# Patient Record
Sex: Female | Born: 1975 | Hispanic: No | Marital: Married | Smoking: Never smoker
Health system: Southern US, Community
[De-identification: ages and names within clinical notes are randomized; demographics above are authoritative.]

---

## 2007-10-29 ENCOUNTER — Ambulatory Visit (HOSPITAL_COMMUNITY): Admission: RE | Admit: 2007-10-29 | Discharge: 2007-10-29 | Payer: Self-pay | Admitting: Family Medicine

## 2007-11-11 HISTORY — PX: TUBAL LIGATION: SHX77

## 2007-12-06 ENCOUNTER — Ambulatory Visit (HOSPITAL_COMMUNITY): Admission: RE | Admit: 2007-12-06 | Discharge: 2007-12-06 | Payer: Self-pay | Admitting: Obstetrics & Gynecology

## 2007-12-06 IMAGING — US US OB DETAIL+14 WK
1 series · 14 of 28 positions shown · non-contrast
Comparison: none

OBSTETRICAL ULTRASOUND:
 This ultrasound exam was performed in the [HOSPITAL] Ultrasound Department.  The OB US report was generated in the AS system, and faxed to the ordering physician.  This report is also available in [REDACTED] PACS.

[Series 1: us ob detail+14 wk · 0.26mm/px · 14 of 82 slices shown]
[im 4/82]
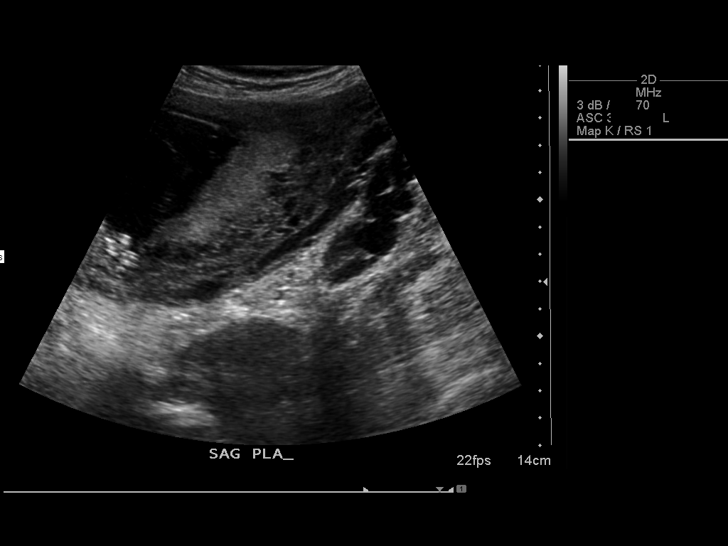
[im 10/82]
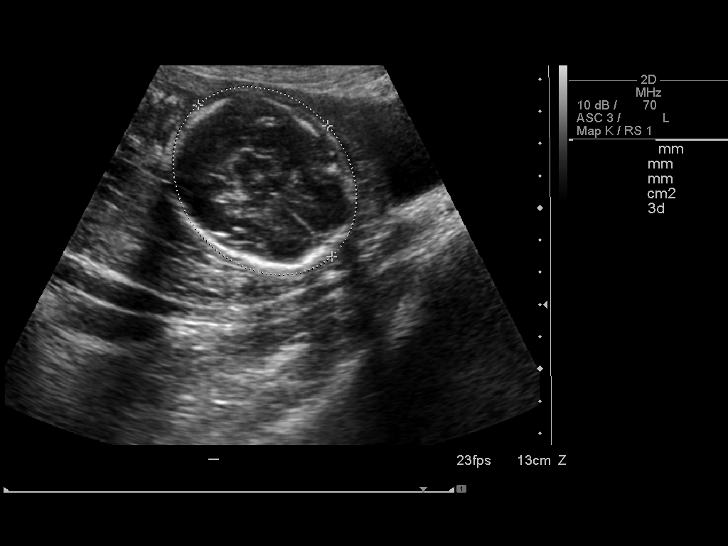
[im 16/82]
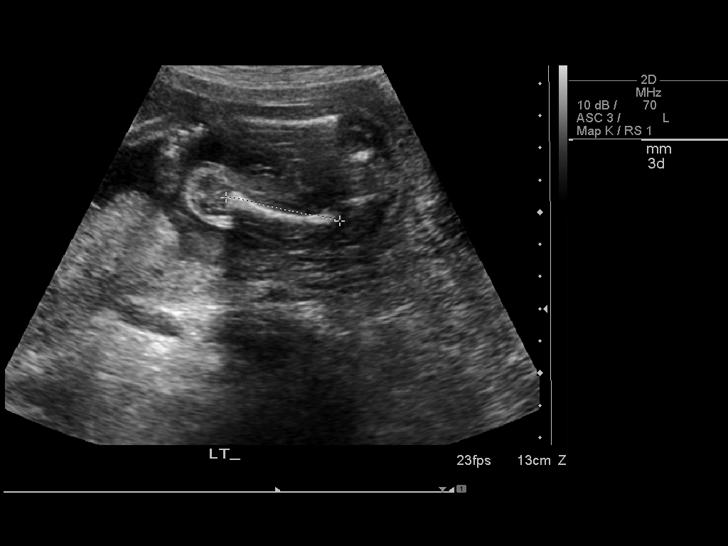
[im 22/82]
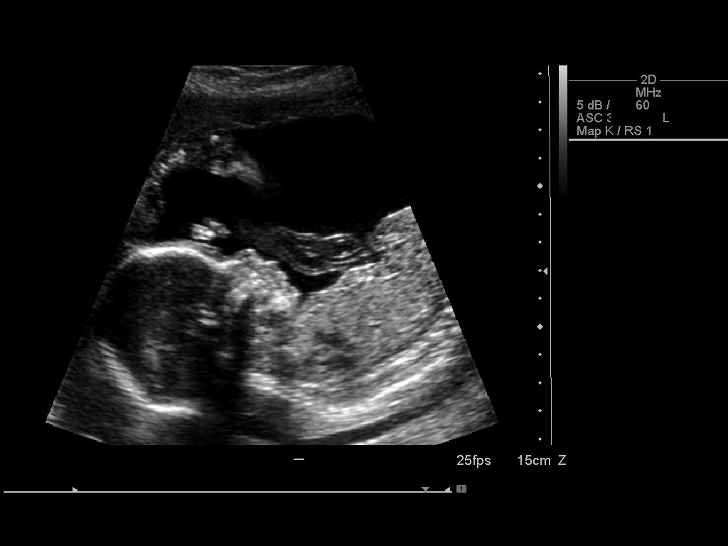
[im 28/82]
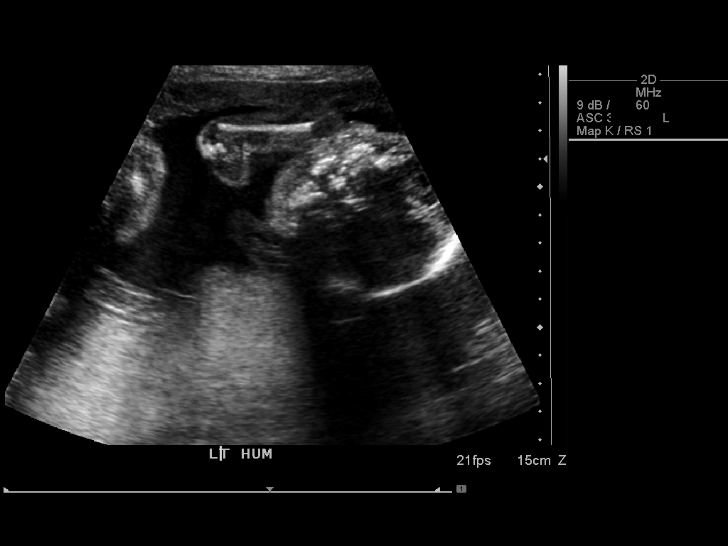
[im 34/82]
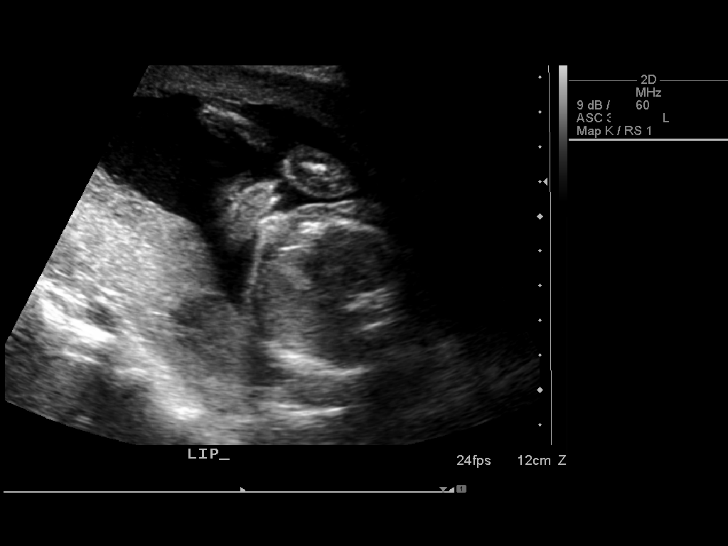
[im 40/82]
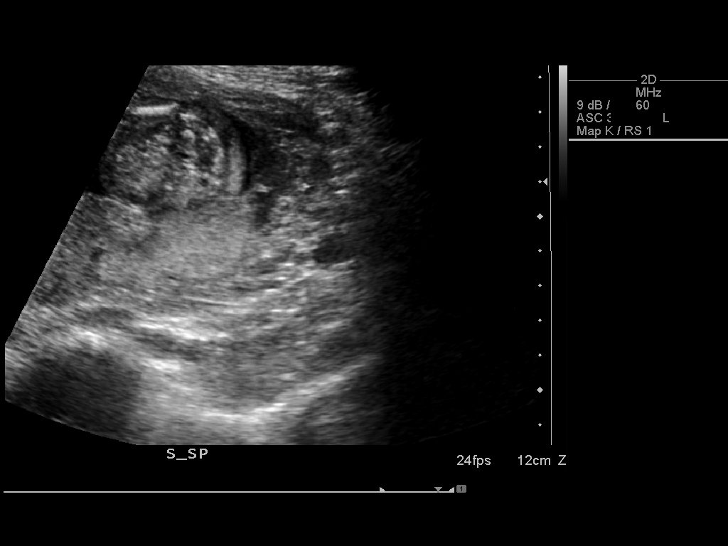
[im 46/82]
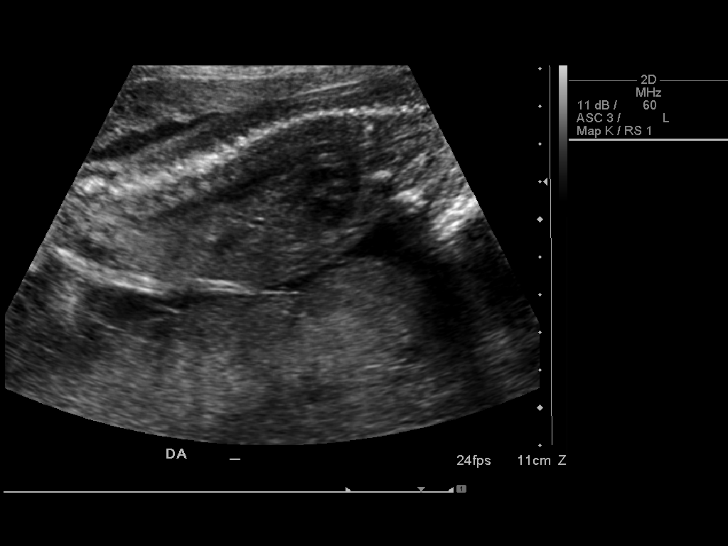
[im 52/82]
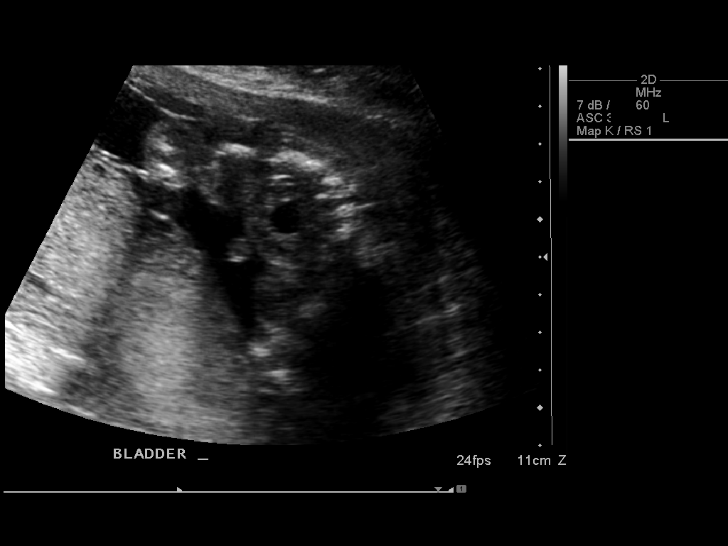
[im 58/82]
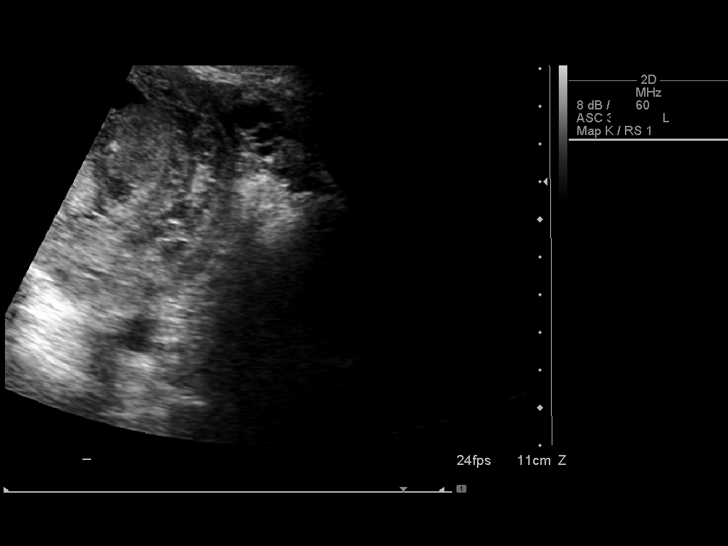
[im 64/82]
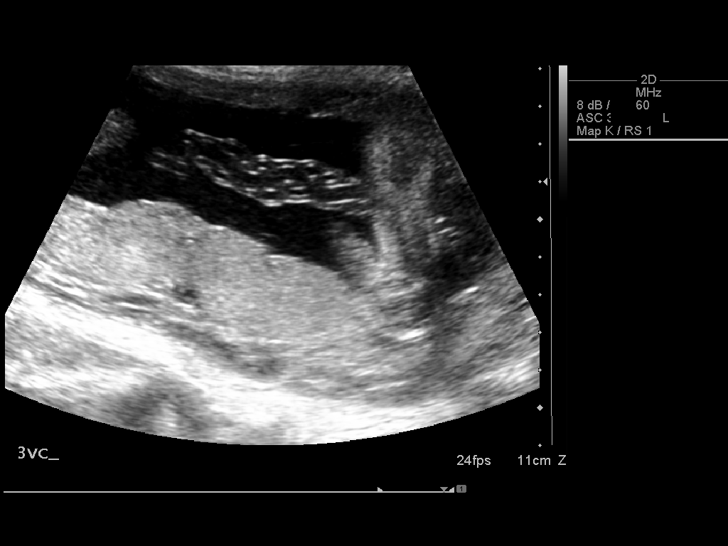
[im 70/82]
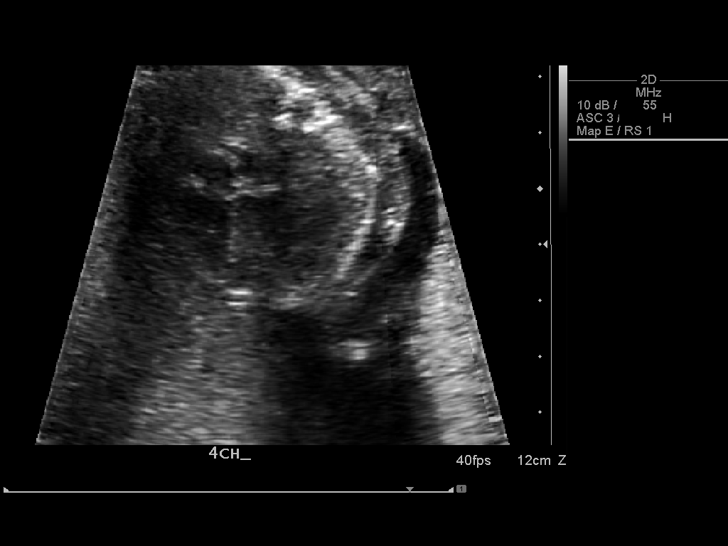
[im 76/82]
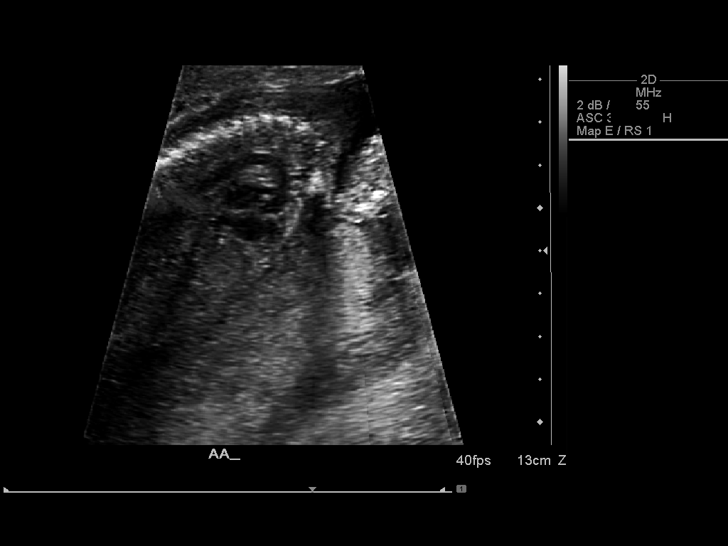
[im 82/82]
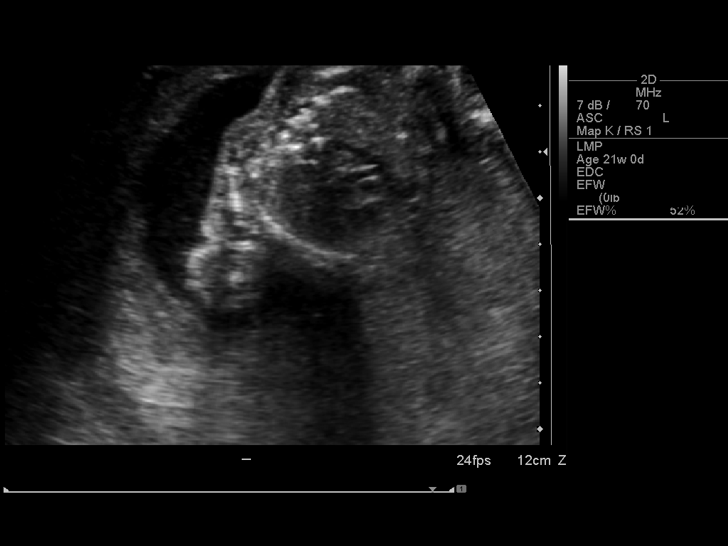

[14 of 28 positions shown; findings below may reference images not displayed]

IMPRESSION: See AS Obstetric US report.

## 2008-04-11 ENCOUNTER — Ambulatory Visit: Payer: Self-pay | Admitting: Obstetrics & Gynecology

## 2008-04-11 ENCOUNTER — Inpatient Hospital Stay (HOSPITAL_COMMUNITY): Admission: AD | Admit: 2008-04-11 | Discharge: 2008-04-13 | Payer: Self-pay | Admitting: Obstetrics & Gynecology

## 2010-05-23 LAB — CBC
Hemoglobin: 11.1 g/dL — ABNORMAL LOW (ref 12.0–15.0)
MCHC: 33 g/dL (ref 30.0–36.0)
RBC: 4.14 MIL/uL (ref 3.87–5.11)
RDW: 14.6 % (ref 11.5–15.5)

## 2010-05-23 LAB — RPR: RPR Ser Ql: NONREACTIVE

## 2010-06-25 NOTE — Discharge Summary (Signed)
NAMETERREE, GAULTNEY                    ACCOUNT NO.:  [id number]   MEDICAL RECORD NO.:  [id number]          PATIENT TYPE:  INP   LOCATION:  9122                          FACILITY:  [facility]   PHYSICIAN:  Allie Bossier, MD        DATE OF BIRTH:  1975-12-20   DATE OF ADMISSION:  04/11/2008  DATE OF DISCHARGE:  04/13/2008                               DISCHARGE SUMMARY   ADMISSION DIAGNOSES:  1. Grand multiparity.  2. Labor at term.   DISCHARGE DIAGNOSES:  1. Spontaneous vaginal delivery.  2. Vigorous female with Apgars 9 and 10.  3. Grand multiparity.  4. Precipitous delivery.   HISTORY:  This is 35 year old G6, P5-0-0-5, who presented to the  Maternity Admissions at 39-5/7th weeks walking in, and shortly after  arrival felt urge to push, was found to be complete +4 at initial exam,  progressed quickly to spontaneous vaginal delivery, vigorous female,  Apgars 9 at 1 minute, 10 at 5 minutes.  She had regular prenatal care at  [facility].  Her OB history was significant for having had 5 term  unattended home deliveries at [place] with no complications.  Significant lab; she is Rh positive, rubella immune and her hemoglobin  here is 11.1 on April 12, 2008.  On April 13, 2007, she underwent post ROM  sterilization by Dr. Marice Potter and this procedure was uncomplicated.  On  hospital day #2, she remained stable, was not having any significant  complaints.  Her vital signs were stable with BP 105/67.  Her  periumbilical incision was covered with small Band-Aid, was clean, dry,  and intact, essentially nontender.  She was involuting well.  She was  discharged home to have followup at [facility] at 6 weeks.  Her meds  included ibuprofen 600 one p.o. q.6 h, Vicodin one n.p.o. q.4 h #20 no  refills, prenatal vitamin one a day to continue for 6 weeks.      Deirdre Christy Gentles, C.N.M.      Allie Bossier, MD  Electronically Signed    DP/MEDQ  D:  04/13/2008  T:  04/13/2008  Job:  [id number]

## 2010-06-25 NOTE — Op Note (Signed)
Gail Mcfarland, Gail Mcfarland                    ACCOUNT NO.:  [id number]   MEDICAL RECORD NO.:  [id number]          PATIENT TYPE:  INP   LOCATION:  9122                          FACILITY:  [facility]   PHYSICIAN:  Allie Bossier, MD        DATE OF BIRTH:  09-21-1975   DATE OF PROCEDURE:  04/12/2008  DATE OF DISCHARGE:                               OPERATIVE REPORT   PREOPERATIVE DIAGNOSIS:  Multiparity, desires sterility.   POSTOPERATIVE DIAGNOSIS:  Multiparity, desires sterility.   PROCEDURE:  Application of Filshie clips, bilateral tubal sterilization  procedure.   SURGEON:  Allie Bossier, MD   ANESTHESIA:  Spinal, Malen Gauze, MD   COMPLICATIONS:  None.   ESTIMATED BLOOD LOSS:  Minimal.   FINDINGS:  Normal oviduct.   DETAIL OF PROCEDURE AND FINDINGS:  I used the language line to explain  the risks, benefits, and alternatives of surgery.  She understands the  failure rate of 1/200 and wishes to proceed.  In the operating room,  spinal anesthesia was applied.  Her abdomen was prepped and draped in  the usual sterile fashion.  After adequate anesthesia was assured, the  umbilicus was inspected.  It appeared that a transverse incision would  give the most appealing cosmetic result postoperatively.  I therefore  elevated the skin with Allis clamps and made a transverse incision in  the crease of the umbilicus.  The fascia was entered as well as  peritoneum.  I was able to visualize the right oviduct and it was  elevated and grasped with a Babcock clamp.  It was then traced to its  fimbriated end.  It was retraced the isthmic region where a Filshie clip  was placed across the entire oviduct in a perpendicular fashion,  approximately 1 mL of 0.5% Marcaine was injected just distal to the  Filshie clip for postop pain relief.  The tube was allowed to fall back  in the abdomen.  There was no bleeding noted.  A repeat procedure was  performed on the patient's contralateral side.  Again, the oviduct was  traced  to the fimbriated end and then retraced to the isthmic region  where a Filshie clip was placed across the entire tube.  Another 1 mL  0.5% Marcaine was injected into the tube just distal to the clamp.  The  tube was allowed to fall back in the abdominal cavity.  The fascia was  elevated with Allis clamps and closed with a running nonlocking 0 Vicryl  suture.  No defects were palpable.  The  subcutaneous tissue was infiltrated with another 8 mL of 0.5% Marcaine.  A subcuticular closure was done with 4-0 Vicryl suture.  Instrument,  sponge, and needle counts were correct.  She tolerated the procedure  well.  She was taken to the recovery room in stable condition.      Allie Bossier, MD  Electronically Signed     MCD/MEDQ  D:  04/12/2008  T:  04/12/2008  Job:  [id number]

## 2012-02-27 ENCOUNTER — Encounter (HOSPITAL_COMMUNITY): Payer: Self-pay | Admitting: *Deleted

## 2012-02-27 ENCOUNTER — Emergency Department (INDEPENDENT_AMBULATORY_CARE_PROVIDER_SITE_OTHER)
Admission: EM | Admit: 2012-02-27 | Discharge: 2012-02-27 | Disposition: A | Discharge status: Home / Self Care | Payer: 59 | Source: Home / Self Care | Attending: Emergency Medicine | Admitting: Emergency Medicine

## 2012-02-27 DIAGNOSIS — N3000 Acute cystitis without hematuria: Secondary | ICD-10-CM

## 2012-02-27 LAB — POCT URINALYSIS DIP (DEVICE)
Specific Gravity, Urine: 1.02 (ref 1.005–1.030)
Urobilinogen, UA: 0.2 mg/dL (ref 0.0–1.0)

## 2012-02-27 MED ORDER — PHENAZOPYRIDINE HCL 200 MG PO TABS: 200.0000 mg | ORAL_TABLET | Freq: Three times a day (TID) | ORAL | Status: DC | PRN

## 2012-02-27 MED ORDER — CEPHALEXIN 500 MG PO CAPS: 500.0000 mg | ORAL_CAPSULE | Freq: Three times a day (TID) | ORAL | Status: DC

## 2012-02-27 NOTE — ED Provider Notes (Signed)
Chief Complaint  Patient presents with  . Hematuria    History of Present Illness:   Gail Mcfarland is a 37 year old [place] female who presents with a one-day history of blood in the urine, dysuria, frequency, chills, and suprapubic pain. She denies fever, nausea, vomiting, back pain, or GYN complaints. She denies any prior history of urinary tract infection.  Review of Systems:  Other than noted above, the patient denies any of the following symptoms: General:  No fevers, chills, sweats, aches, or fatigue. GI:  No abdominal pain, back pain, nausea, vomiting, diarrhea, or constipation. GU:  No dysuria, frequency, urgency, hematuria, or incontinence. GYN:  No discharge, itching, vulvar pain or lesions, pelvic pain, or abnormal vaginal bleeding.  PMFSH:  Past medical history, family history, social history, meds, and allergies were reviewed.  Physical Exam:   Vital signs:  BP 136/76  Pulse 67  Temp 98.5 F (36.9 C) (Oral)  Resp 16  SpO2 98%  LMP 02/13/2012 Gen:  Alert, oriented, in no distress. Lungs:  Clear to auscultation, no wheezes, rales or rhonchi. Heart:  Regular rhythm, no gallop or murmer. Abdomen:  Flat and soft. There was slight suprapubic pain to palpation.  No guarding, or rebound.  No hepato-splenomegaly or mass.  Bowel sounds were normally active.  No hernia. Back:  No CVA tenderness.  Skin:  Clear, warm and dry.  Labs:   Results for orders placed during the hospital encounter of 02/27/12  POCT URINALYSIS DIP (DEVICE)      Component Value Range   Glucose, UA NEGATIVE  NEGATIVE mg/dL   Bilirubin Urine NEGATIVE  NEGATIVE   Ketones, ur NEGATIVE  NEGATIVE mg/dL   Specific Gravity, Urine 1.020  1.005 - 1.030   Hgb urine dipstick LARGE (*) NEGATIVE   pH 7.5  5.0 - 8.0   Protein, ur 100 (*) NEGATIVE mg/dL   Urobilinogen, UA 0.2  0.0 - 1.0 mg/dL   Nitrite NEGATIVE  NEGATIVE   Leukocytes, UA LARGE (*) NEGATIVE  POCT PREGNANCY, URINE      Component Value Range   Preg  Test, Ur NEGATIVE  NEGATIVE     Other Labs Obtained at Urgent Care Center:  A urine culture was obtained.  Results are pending at this time and we will call about any positive results.  Assessment: The encounter diagnosis was Acute cystitis.   Plan:   1.  The following meds were prescribed:   New Prescriptions   CEPHALEXIN (KEFLEX) 500 MG CAPSULE    Take 1 capsule (500 mg total) by mouth 3 (three) times daily.   PHENAZOPYRIDINE (PYRIDIUM) 200 MG TABLET    Take 1 tablet (200 mg total) by mouth 3 (three) times daily as needed for pain.   2.  The patient was instructed in symptomatic care and handouts were given. 3.  The patient was told to return if becoming worse in any way, if no better in 3 or 4 days, and given some red flag symptoms that would indicate earlier return. 4.  The patient was told to avoid intercourse for 10 days, get extra fluids, and return for a follow up with her primary care doctor at the completion of treatment for a repeat UA and culture.     Reuben Likes, MD 02/27/12 [phone]

## 2012-02-27 NOTE — ED Notes (Signed)
C/o hematuria, frequency in small amounts.  C/o pain with urination.

## 2012-02-29 LAB — URINE CULTURE
Colony Count: 4000
Special Requests: NORMAL

## 2013-03-09 ENCOUNTER — Ambulatory Visit: Payer: 59 | Admitting: Physician Assistant

## 2013-03-14 ENCOUNTER — Other Ambulatory Visit (INDEPENDENT_AMBULATORY_CARE_PROVIDER_SITE_OTHER): Payer: 59

## 2013-03-14 ENCOUNTER — Ambulatory Visit: Payer: 59 | Admitting: Internal Medicine

## 2013-03-14 ENCOUNTER — Ambulatory Visit (INDEPENDENT_AMBULATORY_CARE_PROVIDER_SITE_OTHER): Payer: 59 | Admitting: Physician Assistant

## 2013-03-14 ENCOUNTER — Encounter: Payer: Self-pay | Admitting: Physician Assistant

## 2013-03-14 VITALS — BP 100/68 | HR 73 | Temp 98.0°F | Resp 20 | Ht <= 58 in | Wt 104.0 lb

## 2013-03-14 DIAGNOSIS — R1013 Epigastric pain: Secondary | ICD-10-CM

## 2013-03-14 DIAGNOSIS — R319 Hematuria, unspecified: Secondary | ICD-10-CM

## 2013-03-14 DIAGNOSIS — N926 Irregular menstruation, unspecified: Secondary | ICD-10-CM

## 2013-03-14 LAB — LIPASE: Lipase: 13 U/L (ref 11.0–59.0)

## 2013-03-14 LAB — CBC WITH DIFFERENTIAL/PLATELET
Basophils Absolute: 0 10*3/uL (ref 0.0–0.1)
Basophils Relative: 0.8 % (ref 0.0–3.0)
EOS PCT: 3.1 % (ref 0.0–5.0)
Eosinophils Absolute: 0.2 10*3/uL (ref 0.0–0.7)
HCT: 42.1 % (ref 36.0–46.0)
HEMOGLOBIN: 13.9 g/dL (ref 12.0–15.0)
Lymphocytes Relative: 28.5 % (ref 12.0–46.0)
Lymphs Abs: 1.6 10*3/uL (ref 0.7–4.0)
MCHC: 32.9 g/dL (ref 30.0–36.0)
MCV: 88.1 fl (ref 78.0–100.0)
Monocytes Absolute: 0.3 10*3/uL (ref 0.1–1.0)
Monocytes Relative: 4.8 % (ref 3.0–12.0)
Neutro Abs: 3.6 10*3/uL (ref 1.4–7.7)
Neutrophils Relative %: 62.8 % (ref 43.0–77.0)
Platelets: 282 10*3/uL (ref 150.0–400.0)
RBC: 4.78 Mil/uL (ref 3.87–5.11)
RDW: 13 % (ref 11.5–14.6)
WBC: 5.7 10*3/uL (ref 4.5–10.5)

## 2013-03-14 LAB — BASIC METABOLIC PANEL
BUN: 10 mg/dL (ref 6–23)
CALCIUM: 8.9 mg/dL (ref 8.4–10.5)
CO2: 28 meq/L (ref 19–32)
CREATININE: 0.6 mg/dL (ref 0.4–1.2)
Chloride: 103 mEq/L (ref 96–112)
GFR: 131.84 mL/min (ref >60.00)
GLUCOSE: 89 mg/dL (ref 70–99)
POTASSIUM: 3.9 meq/L (ref 3.5–5.1)
SODIUM: 137 meq/L (ref 135–145)

## 2013-03-14 LAB — POCT URINALYSIS DIPSTICK
BILIRUBIN UA: NEGATIVE
Blood, UA: NEGATIVE
GLUCOSE: NEGATIVE
Leukocytes, UA: NEGATIVE
Nitrite, UA: NEGATIVE
SPEC GRAV UA: 1.015
UROBILINOGEN UA: 0.2
pH, UA: 7

## 2013-03-14 LAB — HEPATIC FUNCTION PANEL
ALT: 15 U/L (ref 0–35)
AST: 21 U/L (ref 0–37)
Albumin: 4.1 g/dL (ref 3.5–5.2)
Alkaline Phosphatase: 67 U/L (ref 39–117)
Bilirubin, Direct: 0 mg/dL (ref 0.0–0.3)
Total Bilirubin: 0.6 mg/dL (ref 0.3–1.2)
Total Protein: 7.8 g/dL (ref 6.0–8.3)

## 2013-03-14 LAB — HCG, QUANTITATIVE, PREGNANCY: HCG, BETA CHAIN, QUANT, S: 0.35 m[IU]/mL

## 2013-03-14 LAB — AMYLASE: AMYLASE: 93 U/L (ref 27–131)

## 2013-03-14 NOTE — Progress Notes (Signed)
Subjective:    Patient ID: Virgina H Mcfarland, female    DOB: 08-12-1975, 38 y.o.   MRN: [id number]  HPI Comments: Patient is a 38 year old female who presents to the clinic today to establish care. Patient is [place] and speaks small amount of english. Is accompanied by appointed female liaison. Patient reports a recent history of irregular menstrual cycle, stating they are lasting longer than normal .LMP 02/10/13. States after the last cycle stopped she has seen blood in the toilet and on the tissue when urinating. Reports she is having pain in the upper abdomen also. States pain last for a few minutes and then goes away. No known mitigating or alleviating factors. Reports she has noted an increase in frequency and urgency with urination as well. Has noted an increase in fatigue. Denies N/V/F/C, back/flank pain, blood in stool, dark tarry stool, heartburn symptoms or abdominal bloat or distention.       Review of Systems  Constitutional: Positive for fatigue. Negative for fever, chills and activity change.  Respiratory: Negative for chest tightness, shortness of breath and wheezing.   Gastrointestinal: Negative for nausea, vomiting, diarrhea, constipation and blood in stool.  Genitourinary: Positive for urgency, frequency and hematuria. Negative for dysuria, flank pain, vaginal discharge, vaginal pain, pelvic pain and dyspareunia.  Musculoskeletal: Negative for back pain.  Skin: Negative for rash.  All other systems reviewed and are negative.  History reviewed. No pertinent past medical history. History reviewed. No pertinent family history. History   Social History  . Marital Status: Married    Spouse Name: N/A    Number of Children: N/A  . Years of Education: N/A   Social History Main Topics  . Smoking status: Never Smoker   . Smokeless tobacco: None  . Alcohol Use: No  . Drug Use: No  . Sexual Activity:    Other Topics Concern  . None   Social History Narrative  . None          Objective:   Physical Exam  Vitals reviewed. Constitutional: She is oriented to person, place, and time. She appears well-developed and well-nourished. No distress.  HENT:  Head: Normocephalic and atraumatic.  Eyes: Conjunctivae are normal.  Neck: Normal range of motion.  Cardiovascular: Normal rate, regular rhythm and intact distal pulses.  Exam reveals no gallop and no friction rub.   No murmur heard. Pulmonary/Chest: Effort normal and breath sounds normal. She has no wheezes. She has no rales.  Abdominal: Soft. Normal appearance and bowel sounds are normal. There is no tenderness. There is no rebound, no guarding and no CVA tenderness.  Abdomen benign, non tender to palpation. Unable to reproduce epigastric pain.  Genitourinary: Rectal exam shows no external hemorrhoid. Pelvic exam was performed with patient prone. There is no tenderness on the right labia. There is no tenderness on the left labia. Cervix exhibits no motion tenderness and no discharge. Right adnexum displays no mass and no tenderness. Left adnexum displays no mass and no tenderness. No erythema, tenderness or bleeding around the vagina. No vaginal discharge found.  Exam chaperoned by Basilia Jumbo, CMA  Musculoskeletal: Normal range of motion.  Lymphadenopathy:       Right: No inguinal adenopathy present.       Left: No inguinal adenopathy present.  Neurological: She is alert and oriented to person, place, and time.  Skin: Skin is warm and dry. She is not diaphoretic.  Psychiatric: She has a normal mood and affect.  Filed Vitals:   03/14/13 1115  BP: 100/68  Pulse: 73  Temp: 98 F (36.7 C)  Resp: 20    Results for orders placed in visit on 03/14/13  POCT URINALYSIS DIPSTICK      Result Value Range   Color, UA yellow     Clarity, UA clear     Glucose NEG     Bilirubin, UA NEG     Ketones, UA SM     Spec Grav, UA 1.015     Blood, UA NEG     pH, UA 7.0     Protein, UA TRACE     Urobilinogen, UA 0.2      Nitrite, UA NEG     Leukocytes, UA Negative     Past Surgical History  Procedure Laterality Date  . Tubal ligation  2009/10       Assessment & Plan:    Patient presents to establish care.  Physical exam WNL, POC urine negative for UTI, no blood in urine. Will order labs and review. Counseled patient and liaison if labs normal will consider further testing/imaging.   Referral for GYN  Patient stated understanding and agreement with treatment plan.

## 2013-03-14 NOTE — Progress Notes (Signed)
Pre-visit discussion using our clinic review tool. No additional management support is needed unless otherwise documented below in the visit note.  

## 2013-03-14 NOTE — Patient Instructions (Addendum)
It was great meeting you today Gail Mcfarland! Labs have been ordered for you.   Hematuria, Adult Hematuria is blood in your urine. It can be caused by a bladder infection, kidney infection, prostate infection, kidney stone, or cancer of your urinary tract. Infections can usually be treated with medicine, and a kidney stone usually will pass through your urine. If neither of these is the cause of your hematuria, further workup to find out the reason may be needed. It is very important that you tell your health care provider about any blood you see in your urine, even if the blood stops without treatment or happens without causing pain. Blood in your urine that happens and then stops and then happens again can be a symptom of a very serious condition. Also, pain is not a symptom in the initial stages of many urinary cancers. HOME CARE INSTRUCTIONS   Drink lots of fluid, 3 4 quarts a day. If you have been diagnosed with an infection, cranberry juice is especially recommended, in addition to large amounts of water.  Avoid caffeine, tea, and carbonated beverages, because they tend to irritate the bladder.  Avoid alcohol because it may irritate the prostate.  Only take over-the-counter or prescription medicines for pain, discomfort, or fever as directed by your health care provider.  If you have been diagnosed with a kidney stone, follow your health care provider's instructions regarding straining your urine to catch the stone.  Empty your bladder often. Avoid holding urine for long periods of time.  After a bowel movement, women should cleanse front to back. Use each tissue only once.  Empty your bladder before and after sexual intercourse if you are a female. SEEK MEDICAL CARE IF: You develop back pain, fever, a feeling of sickness in your stomach (nausea), or vomiting or if your symptoms are not better in 3 days. Return sooner if you are getting worse. SEEK IMMEDIATE MEDICAL CARE IF:   You have a  persistent fever, with a temperature of 101.59F (38.8C) or greater.  You develop severe vomiting and are unable to keep the medicine down.  You develop severe back or abdominal pain despite taking your medicines.  You begin passing a large amount of blood or clots in your urine.  You feel extremely weak or faint, or you pass out. MAKE SURE YOU:   Understand these instructions.  Will watch your condition.  Will get help right away if you are not doing well or get worse. Document Released: 01/27/2005 Document Revised: 11/17/2012 Document Reviewed: 09/27/2012 [facility] Patient Information 2014 [place], [place].  Abdominal Pain, Adult Many things can cause belly (abdominal) pain. Most times, the belly pain is not dangerous. Many cases of belly pain can be watched and treated at home. HOME CARE   Do not take medicines that help you go poop (laxatives) unless told to by your doctor.  Only take medicine as told by your doctor.  Eat or drink as told by your doctor. Your doctor will tell you if you should be on a special diet. GET HELP IF:  You do not know what is causing your belly pain.  You have belly pain while you are sick to your stomach (nauseous) or have runny poop (diarrhea).  You have pain while you pee or poop.  Your belly pain wakes you up at night.  You have belly pain that gets worse or better when you eat.  You have belly pain that gets worse when you eat fatty foods. GET  HELP RIGHT AWAY IF:   The pain does not go away within 2 hours.  You have a fever.  You keep throwing up (vomiting).  The pain changes and is only in the right or left part of the belly.  You have bloody or tarry looking poop. MAKE SURE YOU:   Understand these instructions.  Will watch your condition.  Will get help right away if you are not doing well or get worse. Document Released: 07/16/2007 Document Revised: 11/17/2012 Document Reviewed: 10/06/2012 [facility] Patient Information  2014 [place], [place].

## 2013-05-02 ENCOUNTER — Encounter: Payer: 59 | Admitting: Obstetrics & Gynecology

## 2013-11-25 ENCOUNTER — Other Ambulatory Visit (INDEPENDENT_AMBULATORY_CARE_PROVIDER_SITE_OTHER): Payer: 59

## 2013-11-25 ENCOUNTER — Encounter: Payer: Self-pay | Admitting: Internal Medicine

## 2013-11-25 ENCOUNTER — Ambulatory Visit (INDEPENDENT_AMBULATORY_CARE_PROVIDER_SITE_OTHER): Payer: 59 | Admitting: Internal Medicine

## 2013-11-25 VITALS — BP 122/80 | HR 66 | Temp 97.8°F | Resp 16 | Ht <= 58 in | Wt 108.0 lb

## 2013-11-25 DIAGNOSIS — Z0000 Encounter for general adult medical examination without abnormal findings: Secondary | ICD-10-CM

## 2013-11-25 DIAGNOSIS — N926 Irregular menstruation, unspecified: Secondary | ICD-10-CM | POA: Insufficient documentation

## 2013-11-25 LAB — CBC WITH DIFFERENTIAL/PLATELET
Basophils Absolute: 0 10*3/uL (ref 0.0–0.1)
Basophils Relative: 1 % (ref 0.0–3.0)
EOS PCT: 3.4 % (ref 0.0–5.0)
Eosinophils Absolute: 0.2 10*3/uL (ref 0.0–0.7)
HCT: 39.3 % (ref 36.0–46.0)
Hemoglobin: 12.9 g/dL (ref 12.0–15.0)
LYMPHS ABS: 1.7 10*3/uL (ref 0.7–4.0)
Lymphocytes Relative: 36.6 % (ref 12.0–46.0)
MCHC: 32.9 g/dL (ref 30.0–36.0)
MCV: 84.3 fl (ref 78.0–100.0)
MONOS PCT: 6.5 % (ref 3.0–12.0)
Monocytes Absolute: 0.3 10*3/uL (ref 0.1–1.0)
NEUTROS ABS: 2.5 10*3/uL (ref 1.4–7.7)
Neutrophils Relative %: 52.5 % (ref 43.0–77.0)
PLATELETS: 262 10*3/uL (ref 150.0–400.0)
RBC: 4.66 Mil/uL (ref 3.87–5.11)
RDW: 13.6 % (ref 11.5–15.5)
WBC: 4.7 10*3/uL (ref 4.0–10.5)

## 2013-11-25 LAB — LIPID PANEL
CHOLESTEROL: 170 mg/dL (ref 0–200)
HDL: 38.1 mg/dL — AB (ref >39.00)
LDL Cholesterol: 106 mg/dL — ABNORMAL HIGH (ref 0–99)
NONHDL: 131.9
TRIGLYCERIDES: 128 mg/dL (ref 0.0–149.0)
Total CHOL/HDL Ratio: 4
VLDL: 25.6 mg/dL (ref 0.0–40.0)

## 2013-11-25 LAB — COMPREHENSIVE METABOLIC PANEL
ALK PHOS: 58 U/L (ref 39–117)
ALT: 12 U/L (ref 0–35)
AST: 16 U/L (ref 0–37)
Albumin: 3.6 g/dL (ref 3.5–5.2)
BILIRUBIN TOTAL: 0.4 mg/dL (ref 0.2–1.2)
BUN: 8 mg/dL (ref 6–23)
CALCIUM: 9.1 mg/dL (ref 8.4–10.5)
CHLORIDE: 105 meq/L (ref 96–112)
CO2: 27 meq/L (ref 19–32)
CREATININE: 0.6 mg/dL (ref 0.4–1.2)
GFR: 121.12 mL/min (ref >60.00)
GLUCOSE: 101 mg/dL — AB (ref 70–99)
POTASSIUM: 4 meq/L (ref 3.5–5.1)
SODIUM: 137 meq/L (ref 135–145)
TOTAL PROTEIN: 7.9 g/dL (ref 6.0–8.3)

## 2013-11-25 LAB — TSH: TSH: 0.91 u[IU]/mL (ref 0.35–4.50)

## 2013-11-25 LAB — HCG, QUANTITATIVE, PREGNANCY: QUANTITATIVE HCG: 0 m[IU]/mL

## 2013-11-25 NOTE — Progress Notes (Signed)
Pre visit review using our clinic review tool, if applicable. No additional management support is needed unless otherwise documented below in the visit note. 

## 2013-11-25 NOTE — Patient Instructions (Signed)
Preventive Care for Adults A healthy lifestyle and preventive care can promote health and wellness. Preventive health guidelines for women include the following key practices.  A routine yearly physical is a good way to check with your health care provider about your health and preventive screening. It is a chance to share any concerns and updates on your health and to receive a thorough exam.  Visit your dentist for a routine exam and preventive care every 6 months. Brush your teeth twice a day and floss once a day. Good oral hygiene prevents tooth decay and gum disease.  The frequency of eye exams is based on your age, health, family medical history, use of contact lenses, and other factors. Follow your health care provider's recommendations for frequency of eye exams.  Eat a healthy diet. Foods like vegetables, fruits, whole grains, low-fat dairy products, and lean protein foods contain the nutrients you need without too many calories. Decrease your intake of foods high in solid fats, added sugars, and salt. Eat the right amount of calories for you.Get information about a proper diet from your health care provider, if necessary.  Regular physical exercise is one of the most important things you can do for your health. Most adults should get at least 150 minutes of moderate-intensity exercise (any activity that increases your heart rate and causes you to sweat) each week. In addition, most adults need muscle-strengthening exercises on 2 or more days a week.  Maintain a healthy weight. The body mass index (BMI) is a screening tool to identify possible weight problems. It provides an estimate of body fat based on height and weight. Your health care provider can find your BMI and can help you achieve or maintain a healthy weight.For adults 20 years and older:  A BMI below 18.5 is considered underweight.  A BMI of 18.5 to 24.9 is normal.  A BMI of 25 to 29.9 is considered overweight.  A BMI of  30 and above is considered obese.  Maintain normal blood lipids and cholesterol levels by exercising and minimizing your intake of saturated fat. Eat a balanced diet with plenty of fruit and vegetables. Blood tests for lipids and cholesterol should begin at age 76 and be repeated every 5 years. If your lipid or cholesterol levels are high, you are over 50, or you are at high risk for heart disease, you may need your cholesterol levels checked more frequently.Ongoing high lipid and cholesterol levels should be treated with medicines if diet and exercise are not working.  If you smoke, find out from your health care provider how to quit. If you do not use tobacco, do not start.  Lung cancer screening is recommended for adults aged 22-80 years who are at high risk for developing lung cancer because of a history of smoking. A yearly low-dose CT scan of the lungs is recommended for people who have at least a 30-pack-year history of smoking and are a current smoker or have quit within the past 15 years. A pack year of smoking is smoking an average of 1 pack of cigarettes a day for 1 year (for example: 1 pack a day for 30 years or 2 packs a day for 15 years). Yearly screening should continue until the smoker has stopped smoking for at least 15 years. Yearly screening should be stopped for people who develop a health problem that would prevent them from having lung cancer treatment.  If you are pregnant, do not drink alcohol. If you are breastfeeding,  be very cautious about drinking alcohol. If you are not pregnant and choose to drink alcohol, do not have more than 1 drink per day. One drink is considered to be 12 ounces (355 mL) of beer, 5 ounces (148 mL) of wine, or 1.5 ounces (44 mL) of liquor.  Avoid use of street drugs. Do not share needles with anyone. Ask for help if you need support or instructions about stopping the use of drugs.  High blood pressure causes heart disease and increases the risk of  stroke. Your blood pressure should be checked at least every 1 to 2 years. Ongoing high blood pressure should be treated with medicines if weight loss and exercise do not work.  If you are 3-86 years old, ask your health care provider if you should take aspirin to prevent strokes.  Diabetes screening involves taking a blood sample to check your fasting blood sugar level. This should be done once every 3 years, after age 67, if you are within normal weight and without risk factors for diabetes. Testing should be considered at a younger age or be carried out more frequently if you are overweight and have at least 1 risk factor for diabetes.  Breast cancer screening is essential preventive care for women. You should practice "breast self-awareness." This means understanding the normal appearance and feel of your breasts and may include breast self-examination. Any changes detected, no matter how small, should be reported to a health care provider. Women in their 8s and 30s should have a clinical breast exam (CBE) by a health care provider as part of a regular health exam every 1 to 3 years. After age 70, women should have a CBE every year. Starting at age 25, women should consider having a mammogram (breast X-ray test) every year. Women who have a family history of breast cancer should talk to their health care provider about genetic screening. Women at a high risk of breast cancer should talk to their health care providers about having an MRI and a mammogram every year.  Breast cancer gene (BRCA)-related cancer risk assessment is recommended for women who have family members with BRCA-related cancers. BRCA-related cancers include breast, ovarian, tubal, and peritoneal cancers. Having family members with these cancers may be associated with an increased risk for harmful changes (mutations) in the breast cancer genes BRCA1 and BRCA2. Results of the assessment will determine the need for genetic counseling and  BRCA1 and BRCA2 testing.  Routine pelvic exams to screen for cancer are no longer recommended for nonpregnant women who are considered low risk for cancer of the pelvic organs (ovaries, uterus, and vagina) and who do not have symptoms. Ask your health care provider if a screening pelvic exam is right for you.  If you have had past treatment for cervical cancer or a condition that could lead to cancer, you need Pap tests and screening for cancer for at least 20 years after your treatment. If Pap tests have been discontinued, your risk factors (such as having a new sexual partner) need to be reassessed to determine if screening should be resumed. Some women have medical problems that increase the chance of getting cervical cancer. In these cases, your health care provider may recommend more frequent screening and Pap tests.  The HPV test is an additional test that may be used for cervical cancer screening. The HPV test looks for the virus that can cause the cell changes on the cervix. The cells collected during the Pap test can be  tested for HPV. The HPV test could be used to screen women aged 30 years and older, and should be used in women of any age who have unclear Pap test results. After the age of 30, women should have HPV testing at the same frequency as a Pap test.  Colorectal cancer can be detected and often prevented. Most routine colorectal cancer screening begins at the age of 50 years and continues through age 75 years. However, your health care provider may recommend screening at an earlier age if you have risk factors for colon cancer. On a yearly basis, your health care provider may provide home test kits to check for hidden blood in the stool. Use of a small camera at the end of a tube, to directly examine the colon (sigmoidoscopy or colonoscopy), can detect the earliest forms of colorectal cancer. Talk to your health care provider about this at age 50, when routine screening begins. Direct  exam of the colon should be repeated every 5-10 years through age 75 years, unless early forms of pre-cancerous polyps or small growths are found.  People who are at an increased risk for hepatitis B should be screened for this virus. You are considered at high risk for hepatitis B if:  You were born in a country where hepatitis B occurs often. Talk with your health care provider about which countries are considered high risk.  Your parents were born in a high-risk country and you have not received a shot to protect against hepatitis B (hepatitis B vaccine).  You have HIV or AIDS.  You use needles to inject street drugs.  You live with, or have sex with, someone who has hepatitis B.  You get hemodialysis treatment.  You take certain medicines for conditions like cancer, organ transplantation, and autoimmune conditions.  Hepatitis C blood testing is recommended for all people born from 1945 through 1965 and any individual with known risks for hepatitis C.  Practice safe sex. Use condoms and avoid high-risk sexual practices to reduce the spread of sexually transmitted infections (STIs). STIs include gonorrhea, chlamydia, syphilis, trichomonas, herpes, HPV, and human immunodeficiency virus (HIV). Herpes, HIV, and HPV are viral illnesses that have no cure. They can result in disability, cancer, and death.  You should be screened for sexually transmitted illnesses (STIs) including gonorrhea and chlamydia if:  You are sexually active and are younger than 24 years.  You are older than 24 years and your health care provider tells you that you are at risk for this type of infection.  Your sexual activity has changed since you were last screened and you are at an increased risk for chlamydia or gonorrhea. Ask your health care provider if you are at risk.  If you are at risk of being infected with HIV, it is recommended that you take a prescription medicine daily to prevent HIV infection. This is  called preexposure prophylaxis (PrEP). You are considered at risk if:  You are a heterosexual woman, are sexually active, and are at increased risk for HIV infection.  You take drugs by injection.  You are sexually active with a partner who has HIV.  Talk with your health care provider about whether you are at high risk of being infected with HIV. If you choose to begin PrEP, you should first be tested for HIV. You should then be tested every 3 months for as long as you are taking PrEP.  Osteoporosis is a disease in which the bones lose minerals and strength   with aging. This can result in serious bone fractures or breaks. The risk of osteoporosis can be identified using a bone density scan. Women ages 65 years and over and women at risk for fractures or osteoporosis should discuss screening with their health care providers. Ask your health care provider whether you should take a calcium supplement or vitamin D to reduce the rate of osteoporosis.  Menopause can be associated with physical symptoms and risks. Hormone replacement therapy is available to decrease symptoms and risks. You should talk to your health care provider about whether hormone replacement therapy is right for you.  Use sunscreen. Apply sunscreen liberally and repeatedly throughout the day. You should seek shade when your shadow is shorter than you. Protect yourself by wearing long sleeves, pants, a wide-brimmed hat, and sunglasses year round, whenever you are outdoors.  Once a month, do a whole body skin exam, using a mirror to look at the skin on your back. Tell your health care provider of new moles, moles that have irregular borders, moles that are larger than a pencil eraser, or moles that have changed in shape or color.  Stay current with required vaccines (immunizations).  Influenza vaccine. All adults should be immunized every year.  Tetanus, diphtheria, and acellular pertussis (Td, Tdap) vaccine. Pregnant women should  receive 1 dose of Tdap vaccine during each pregnancy. The dose should be obtained regardless of the length of time since the last dose. Immunization is preferred during the 27th-36th week of gestation. An adult who has not previously received Tdap or who does not know her vaccine status should receive 1 dose of Tdap. This initial dose should be followed by tetanus and diphtheria toxoids (Td) booster doses every 10 years. Adults with an unknown or incomplete history of completing a 3-dose immunization series with Td-containing vaccines should begin or complete a primary immunization series including a Tdap dose. Adults should receive a Td booster every 10 years.  Varicella vaccine. An adult without evidence of immunity to varicella should receive 2 doses or a second dose if she has previously received 1 dose. Pregnant females who do not have evidence of immunity should receive the first dose after pregnancy. This first dose should be obtained before leaving the health care facility. The second dose should be obtained 4-8 weeks after the first dose.  Human papillomavirus (HPV) vaccine. Females aged 13-26 years who have not received the vaccine previously should obtain the 3-dose series. The vaccine is not recommended for use in pregnant females. However, pregnancy testing is not needed before receiving a dose. If a female is found to be pregnant after receiving a dose, no treatment is needed. In that case, the remaining doses should be delayed until after the pregnancy. Immunization is recommended for any person with an immunocompromised condition through the age of 26 years if she did not get any or all doses earlier. During the 3-dose series, the second dose should be obtained 4-8 weeks after the first dose. The third dose should be obtained 24 weeks after the first dose and 16 weeks after the second dose.  Zoster vaccine. One dose is recommended for adults aged 60 years or older unless certain conditions are  present.  Measles, mumps, and rubella (MMR) vaccine. Adults born before 1957 generally are considered immune to measles and mumps. Adults born in 1957 or later should have 1 or more doses of MMR vaccine unless there is a contraindication to the vaccine or there is laboratory evidence of immunity to   each of the three diseases. A routine second dose of MMR vaccine should be obtained at least 28 days after the first dose for students attending postsecondary schools, health care workers, or international travelers. People who received inactivated measles vaccine or an unknown type of measles vaccine during 1963-1967 should receive 2 doses of MMR vaccine. People who received inactivated mumps vaccine or an unknown type of mumps vaccine before 1979 and are at high risk for mumps infection should consider immunization with 2 doses of MMR vaccine. For females of childbearing age, rubella immunity should be determined. If there is no evidence of immunity, females who are not pregnant should be vaccinated. If there is no evidence of immunity, females who are pregnant should delay immunization until after pregnancy. Unvaccinated health care workers born before 1957 who lack laboratory evidence of measles, mumps, or rubella immunity or laboratory confirmation of disease should consider measles and mumps immunization with 2 doses of MMR vaccine or rubella immunization with 1 dose of MMR vaccine.  Pneumococcal 13-valent conjugate (PCV13) vaccine. When indicated, a person who is uncertain of her immunization history and has no record of immunization should receive the PCV13 vaccine. An adult aged 19 years or older who has certain medical conditions and has not been previously immunized should receive 1 dose of PCV13 vaccine. This PCV13 should be followed with a dose of pneumococcal polysaccharide (PPSV23) vaccine. The PPSV23 vaccine dose should be obtained at least 8 weeks after the dose of PCV13 vaccine. An adult aged 19  years or older who has certain medical conditions and previously received 1 or more doses of PPSV23 vaccine should receive 1 dose of PCV13. The PCV13 vaccine dose should be obtained 1 or more years after the last PPSV23 vaccine dose.  Pneumococcal polysaccharide (PPSV23) vaccine. When PCV13 is also indicated, PCV13 should be obtained first. All adults aged 65 years and older should be immunized. An adult younger than age 65 years who has certain medical conditions should be immunized. Any person who resides in a nursing home or long-term care facility should be immunized. An adult smoker should be immunized. People with an immunocompromised condition and certain other conditions should receive both PCV13 and PPSV23 vaccines. People with human immunodeficiency virus (HIV) infection should be immunized as soon as possible after diagnosis. Immunization during chemotherapy or radiation therapy should be avoided. Routine use of PPSV23 vaccine is not recommended for American Indians, [place] Natives, or people younger than 65 years unless there are medical conditions that require PPSV23 vaccine. When indicated, people who have unknown immunization and have no record of immunization should receive PPSV23 vaccine. One-time revaccination 5 years after the first dose of PPSV23 is recommended for people aged 19-64 years who have chronic kidney failure, nephrotic syndrome, asplenia, or immunocompromised conditions. People who received 1-2 doses of PPSV23 before age 65 years should receive another dose of PPSV23 vaccine at age 65 years or later if at least 5 years have passed since the previous dose. Doses of PPSV23 are not needed for people immunized with PPSV23 at or after age 65 years.  Meningococcal vaccine. Adults with asplenia or persistent complement component deficiencies should receive 2 doses of quadrivalent meningococcal conjugate (MenACWY-D) vaccine. The doses should be obtained at least 2 months apart.  Microbiologists working with certain meningococcal bacteria, military recruits, people at risk during an outbreak, and people who travel to or live in countries with a high rate of meningitis should be immunized. A first-year college student up through age   21 years who is living in a residence hall should receive a dose if she did not receive a dose on or after her 16th birthday. Adults who have certain high-risk conditions should receive one or more doses of vaccine.  Hepatitis A vaccine. Adults who wish to be protected from this disease, have certain high-risk conditions, work with hepatitis A-infected animals, work in hepatitis A research labs, or travel to or work in countries with a high rate of hepatitis A should be immunized. Adults who were previously unvaccinated and who anticipate close contact with an international adoptee during the first 60 days after arrival in the [place] from a country with a high rate of hepatitis A should be immunized.  Hepatitis B vaccine. Adults who wish to be protected from this disease, have certain high-risk conditions, may be exposed to blood or other infectious body fluids, are household contacts or sex partners of hepatitis B positive people, are clients or workers in certain care facilities, or travel to or work in countries with a high rate of hepatitis B should be immunized.  Haemophilus influenzae type b (Hib) vaccine. A previously unvaccinated person with asplenia or sickle cell disease or having a scheduled splenectomy should receive 1 dose of Hib vaccine. Regardless of previous immunization, a recipient of a hematopoietic stem cell transplant should receive a 3-dose series 6-12 months after her successful transplant. Hib vaccine is not recommended for adults with HIV infection. Preventive Services / Frequency Ages 64 to 68 years  Blood pressure check.** / Every 1 to 2 years.  Lipid and cholesterol check.** / Every 5 years beginning at age  22.  Clinical breast exam.** / Every 3 years for women in their 88s and 53s.  BRCA-related cancer risk assessment.** / For women who have family members with a BRCA-related cancer (breast, ovarian, tubal, or peritoneal cancers).  Pap test.** / Every 2 years from ages 90 through 51. Every 3 years starting at age 21 through age 56 or 3 with a history of 3 consecutive normal Pap tests.  HPV screening.** / Every 3 years from ages 24 through ages 1 to 46 with a history of 3 consecutive normal Pap tests.  Hepatitis C blood test.** / For any individual with known risks for hepatitis C.  Skin self-exam. / Monthly.  Influenza vaccine. / Every year.  Tetanus, diphtheria, and acellular pertussis (Tdap, Td) vaccine.** / Consult your health care provider. Pregnant women should receive 1 dose of Tdap vaccine during each pregnancy. 1 dose of Td every 10 years.  Varicella vaccine.** / Consult your health care provider. Pregnant females who do not have evidence of immunity should receive the first dose after pregnancy.  HPV vaccine. / 3 doses over 6 months, if 72 and younger. The vaccine is not recommended for use in pregnant females. However, pregnancy testing is not needed before receiving a dose.  Measles, mumps, rubella (MMR) vaccine.** / You need at least 1 dose of MMR if you were born in 1957 or later. You may also need a 2nd dose. For females of childbearing age, rubella immunity should be determined. If there is no evidence of immunity, females who are not pregnant should be vaccinated. If there is no evidence of immunity, females who are pregnant should delay immunization until after pregnancy.  Pneumococcal 13-valent conjugate (PCV13) vaccine.** / Consult your health care provider.  Pneumococcal polysaccharide (PPSV23) vaccine.** / 1 to 2 doses if you smoke cigarettes or if you have certain conditions.  Meningococcal vaccine.** /  1 dose if you are age 19 to 21 years and a first-year college  student living in a residence hall, or have one of several medical conditions, you need to get vaccinated against meningococcal disease. You may also need additional booster doses.  Hepatitis A vaccine.** / Consult your health care provider.  Hepatitis B vaccine.** / Consult your health care provider.  Haemophilus influenzae type b (Hib) vaccine.** / Consult your health care provider. Ages 40 to 64 years  Blood pressure check.** / Every 1 to 2 years.  Lipid and cholesterol check.** / Every 5 years beginning at age 20 years.  Lung cancer screening. / Every year if you are aged 55-80 years and have a 30-pack-year history of smoking and currently smoke or have quit within the past 15 years. Yearly screening is stopped once you have quit smoking for at least 15 years or develop a health problem that would prevent you from having lung cancer treatment.  Clinical breast exam.** / Every year after age 40 years.  BRCA-related cancer risk assessment.** / For women who have family members with a BRCA-related cancer (breast, ovarian, tubal, or peritoneal cancers).  Mammogram.** / Every year beginning at age 40 years and continuing for as long as you are in good health. Consult with your health care provider.  Pap test.** / Every 3 years starting at age 30 years through age 65 or 70 years with a history of 3 consecutive normal Pap tests.  HPV screening.** / Every 3 years from ages 30 years through ages 65 to 70 years with a history of 3 consecutive normal Pap tests.  Fecal occult blood test (FOBT) of stool. / Every year beginning at age 50 years and continuing until age 75 years. You may not need to do this test if you get a colonoscopy every 10 years.  Flexible sigmoidoscopy or colonoscopy.** / Every 5 years for a flexible sigmoidoscopy or every 10 years for a colonoscopy beginning at age 50 years and continuing until age 75 years.  Hepatitis C blood test.** / For all people born from 1945 through  1965 and any individual with known risks for hepatitis C.  Skin self-exam. / Monthly.  Influenza vaccine. / Every year.  Tetanus, diphtheria, and acellular pertussis (Tdap/Td) vaccine.** / Consult your health care provider. Pregnant women should receive 1 dose of Tdap vaccine during each pregnancy. 1 dose of Td every 10 years.  Varicella vaccine.** / Consult your health care provider. Pregnant females who do not have evidence of immunity should receive the first dose after pregnancy.  Zoster vaccine.** / 1 dose for adults aged 60 years or older.  Measles, mumps, rubella (MMR) vaccine.** / You need at least 1 dose of MMR if you were born in 1957 or later. You may also need a 2nd dose. For females of childbearing age, rubella immunity should be determined. If there is no evidence of immunity, females who are not pregnant should be vaccinated. If there is no evidence of immunity, females who are pregnant should delay immunization until after pregnancy.  Pneumococcal 13-valent conjugate (PCV13) vaccine.** / Consult your health care provider.  Pneumococcal polysaccharide (PPSV23) vaccine.** / 1 to 2 doses if you smoke cigarettes or if you have certain conditions.  Meningococcal vaccine.** / Consult your health care provider.  Hepatitis A vaccine.** / Consult your health care provider.  Hepatitis B vaccine.** / Consult your health care provider.  Haemophilus influenzae type b (Hib) vaccine.** / Consult your health care provider. Ages 65   years and over  Blood pressure check.** / Every 1 to 2 years.  Lipid and cholesterol check.** / Every 5 years beginning at age 22 years.  Lung cancer screening. / Every year if you are aged 73-80 years and have a 30-pack-year history of smoking and currently smoke or have quit within the past 15 years. Yearly screening is stopped once you have quit smoking for at least 15 years or develop a health problem that would prevent you from having lung cancer  treatment.  Clinical breast exam.** / Every year after age 4 years.  BRCA-related cancer risk assessment.** / For women who have family members with a BRCA-related cancer (breast, ovarian, tubal, or peritoneal cancers).  Mammogram.** / Every year beginning at age 40 years and continuing for as long as you are in good health. Consult with your health care provider.  Pap test.** / Every 3 years starting at age 9 years through age 34 or 91 years with 3 consecutive normal Pap tests. Testing can be stopped between 65 and 70 years with 3 consecutive normal Pap tests and no abnormal Pap or HPV tests in the past 10 years.  HPV screening.** / Every 3 years from ages 57 years through ages 64 or 45 years with a history of 3 consecutive normal Pap tests. Testing can be stopped between 65 and 70 years with 3 consecutive normal Pap tests and no abnormal Pap or HPV tests in the past 10 years.  Fecal occult blood test (FOBT) of stool. / Every year beginning at age 15 years and continuing until age 17 years. You may not need to do this test if you get a colonoscopy every 10 years.  Flexible sigmoidoscopy or colonoscopy.** / Every 5 years for a flexible sigmoidoscopy or every 10 years for a colonoscopy beginning at age 86 years and continuing until age 71 years.  Hepatitis C blood test.** / For all people born from [birth year] through 1965 and any individual with known risks for hepatitis C.  Osteoporosis screening.** / A one-time screening for women ages 83 years and over and women at risk for fractures or osteoporosis.  Skin self-exam. / Monthly.  Influenza vaccine. / Every year.  Tetanus, diphtheria, and acellular pertussis (Tdap/Td) vaccine.** / 1 dose of Td every 10 years.  Varicella vaccine.** / Consult your health care provider.  Zoster vaccine.** / 1 dose for adults aged 61 years or older.  Pneumococcal 13-valent conjugate (PCV13) vaccine.** / Consult your health care provider.  Pneumococcal  polysaccharide (PPSV23) vaccine.** / 1 dose for all adults aged 28 years and older.  Meningococcal vaccine.** / Consult your health care provider.  Hepatitis A vaccine.** / Consult your health care provider.  Hepatitis B vaccine.** / Consult your health care provider.  Haemophilus influenzae type b (Hib) vaccine.** / Consult your health care provider. ** Family history and personal history of risk and conditions may change your health care provider's recommendations. Document Released: 03/25/2001 Document Revised: 06/13/2013 Document Reviewed: 06/24/2010 [facility] Patient Information 2015 [place], [place]. This information is not intended to replace advice given to you by your health care provider. Make sure you discuss any questions you have with your health care provider.

## 2013-11-25 NOTE — Assessment & Plan Note (Signed)
Exam done Vaccines were reviewed and updated [place] ordered Pt ed material was given

## 2013-11-25 NOTE — Assessment & Plan Note (Signed)
GYN referral 

## 2013-11-25 NOTE — Progress Notes (Signed)
   Subjective:    Patient ID: Gail Mcfarland, female    DOB: 08/24/1975, 38 y.o.   MRN: [id number]  HPI Comments: New to me she comes in for a physical but she also complains of irregular and very heavy cycles, she wants to see a GYN doctor.     Review of Systems  Constitutional: Negative.  Negative for fever, chills, diaphoresis, activity change, appetite change, fatigue and unexpected weight change.  HENT: Negative.   Eyes: Negative.   Respiratory: Negative.  Negative for cough, choking, chest tightness, shortness of breath, wheezing and stridor.   Cardiovascular: Negative.  Negative for chest pain, palpitations and leg swelling.  Gastrointestinal: Negative.  Negative for nausea, vomiting, abdominal pain, diarrhea, constipation and blood in stool.  Endocrine: Negative.   Genitourinary: Positive for menstrual problem. Negative for urgency, decreased urine volume, vaginal bleeding, vaginal discharge, difficulty urinating, vaginal pain, pelvic pain and dyspareunia.  Musculoskeletal: Negative.   Skin: Negative.   Allergic/Immunologic: Negative.   Neurological: Negative.   Hematological: Negative.  Negative for adenopathy. Does not bruise/bleed easily.  Psychiatric/Behavioral: Negative.        Objective:   Physical Exam  Vitals reviewed. Constitutional: She is oriented to person, place, and time. She appears well-developed and well-nourished. No distress.  HENT:  Head: Normocephalic and atraumatic.  Mouth/Throat: Oropharynx is clear and moist. No oropharyngeal exudate.  Eyes: Conjunctivae are normal. Right eye exhibits no discharge. Left eye exhibits no discharge. No scleral icterus.  Neck: Normal range of motion. Neck supple. No JVD present. No tracheal deviation present. No thyromegaly present.  Cardiovascular: Normal rate, regular rhythm, normal heart sounds and intact distal pulses.  Exam reveals no gallop and no friction rub.   No murmur heard. Pulmonary/Chest: Effort normal and  breath sounds normal. No stridor. No respiratory distress. She has no wheezes. She has no rales. She exhibits no tenderness.  Abdominal: Soft. Bowel sounds are normal. She exhibits no distension and no mass. There is no tenderness. There is no rebound and no guarding.  Genitourinary:  She refused a GU exam today, will refer to GYN  Musculoskeletal: Normal range of motion. She exhibits no edema and no tenderness.  Lymphadenopathy:    She has no cervical adenopathy.  Neurological: She is oriented to person, place, and time.  Skin: Skin is warm and dry. No rash noted. She is not diaphoretic. No erythema. No pallor.  Psychiatric: She has a normal mood and affect. Her behavior is normal. Judgment and thought content normal.     Lab Results  Component Value Date   WBC 5.7 03/14/2013   HGB 13.9 03/14/2013   HCT 42.1 03/14/2013   PLT 282.0 03/14/2013   GLUCOSE 89 03/14/2013   ALT 15 03/14/2013   AST 21 03/14/2013   NA 137 03/14/2013   K 3.9 03/14/2013   CL 103 03/14/2013   CREATININE 0.6 03/14/2013   BUN 10 03/14/2013   CO2 28 03/14/2013       Assessment & Plan:

## 2014-11-18 ENCOUNTER — Encounter (HOSPITAL_COMMUNITY): Payer: Self-pay

## 2016-09-30 ENCOUNTER — Ambulatory Visit: Payer: Self-pay

## 2016-09-30 ENCOUNTER — Other Ambulatory Visit: Payer: Self-pay | Admitting: Occupational Medicine

## 2016-09-30 DIAGNOSIS — M25521 Pain in right elbow: Secondary | ICD-10-CM

## 2022-09-04 ENCOUNTER — Other Ambulatory Visit: Payer: Self-pay

## 2022-09-04 ENCOUNTER — Encounter (HOSPITAL_COMMUNITY): Payer: Self-pay | Admitting: Emergency Medicine

## 2022-09-04 ENCOUNTER — Emergency Department (HOSPITAL_COMMUNITY): Payer: BC Managed Care – PPO

## 2022-09-04 ENCOUNTER — Emergency Department (HOSPITAL_COMMUNITY)
Admission: EM | Admit: 2022-09-04 | Discharge: 2022-09-05 | Disposition: A | Discharge status: Home / Self Care | Payer: BC Managed Care – PPO | Source: Home / Self Care | Attending: Emergency Medicine | Admitting: Emergency Medicine

## 2022-09-04 DIAGNOSIS — G43809 Other migraine, not intractable, without status migrainosus: Secondary | ICD-10-CM | POA: Diagnosis not present

## 2022-09-04 DIAGNOSIS — R519 Headache, unspecified: Secondary | ICD-10-CM | POA: Diagnosis present

## 2022-09-04 LAB — HCG, SERUM, QUALITATIVE: Preg, Serum: NEGATIVE

## 2022-09-04 LAB — CBC
HCT: 38.1 % (ref 36.0–46.0)
Hemoglobin: 12.4 g/dL (ref 12.0–15.0)
MCH: 28.4 pg (ref 26.0–34.0)
MCHC: 32.5 g/dL (ref 30.0–36.0)
MCV: 87.4 fL (ref 80.0–100.0)
Platelets: 312 10*3/uL (ref 150–400)
RBC: 4.36 MIL/uL (ref 3.87–5.11)
RDW: 12 % (ref 11.5–15.5)
WBC: 7.4 10*3/uL (ref 4.0–10.5)
nRBC: 0 % (ref 0.0–0.2)

## 2022-09-04 LAB — COMPREHENSIVE METABOLIC PANEL
ALT: 18 U/L (ref 0–44)
AST: 20 U/L (ref 15–41)
Albumin: 3.8 g/dL (ref 3.5–5.0)
Alkaline Phosphatase: 73 U/L (ref 38–126)
Anion gap: 9 (ref 5–15)
BUN: 17 mg/dL (ref 6–20)
CO2: 29 mmol/L (ref 22–32)
Calcium: 8.9 mg/dL (ref 8.9–10.3)
Chloride: 102 mmol/L (ref 98–111)
Creatinine, Ser: 0.77 mg/dL (ref 0.44–1.00)
GFR, Estimated: >60 mL/min (ref >60)
Glucose, Bld: 93 mg/dL (ref 70–99)
Potassium: 3.9 mmol/L (ref 3.5–5.1)
Sodium: 140 mmol/L (ref 135–145)
Total Bilirubin: 0.2 mg/dL — ABNORMAL LOW (ref 0.3–1.2)
Total Protein: 7.1 g/dL (ref 6.5–8.1)

## 2022-09-04 LAB — LIPASE, BLOOD: Lipase: 27 U/L (ref 11–51)

## 2022-09-04 MED ORDER — METOCLOPRAMIDE HCL 5 MG/ML IJ SOLN
10.0000 mg | Freq: Once | INTRAMUSCULAR | Status: AC
  Administered 2022-09-04: 10 mg via INTRAVENOUS
  Filled 2022-09-04: qty 2

## 2022-09-04 MED ORDER — SODIUM CHLORIDE 0.9 % IV BOLUS
1000.0000 mL | Freq: Once | INTRAVENOUS | Status: AC
  Administered 2022-09-04: 1000 mL via INTRAVENOUS

## 2022-09-04 MED ORDER — KETOROLAC TROMETHAMINE 15 MG/ML IJ SOLN
15.0000 mg | Freq: Once | INTRAMUSCULAR | Status: AC
  Administered 2022-09-04: 15 mg via INTRAVENOUS
  Filled 2022-09-04: qty 1

## 2022-09-04 MED ORDER — IOHEXOL 350 MG/ML SOLN
75.0000 mL | Freq: Once | INTRAVENOUS | Status: AC | PRN
  Administered 2022-09-04: 75 mL via INTRAVENOUS

## 2022-09-04 MED ORDER — DIPHENHYDRAMINE HCL 50 MG/ML IJ SOLN
25.0000 mg | Freq: Once | INTRAMUSCULAR | Status: AC
  Administered 2022-09-04: 25 mg via INTRAVENOUS
  Filled 2022-09-04: qty 1

## 2022-09-04 NOTE — ED Provider Notes (Signed)
[place] [facility] AT [facility] Provider Note   CSN: [id number] Arrival date & time: 09/04/22  1713     History  Chief Complaint  Patient presents with   Migraine    Gail Mcfarland is a 47 y.o. female otherwise healthy presents today for evaluation of headache.  Patient states that she has had headache for about a week.  States the pain started from the back of her neck going to the top of the head.  Pain is constant and changes in severity, worse at night.  She also complains of right arm numbness especially at night.  She does not have a history of migraines or see any neurologist for this but she does have similar headache in the past.  She endorses nausea without vomiting.  She denies fever, dizziness, vision changes, chest pain, shortness of breath.  States she has tried Tylenol and ibuprofen at home with no relief.  Migraine     History reviewed. No pertinent past medical history. Past Surgical History:  Procedure Laterality Date   TUBAL LIGATION  2009/10     Home Medications Prior to Admission medications   Not on File      Allergies    Patient has no known allergies.    Review of Systems   Review of Systems Negative except as per HPI.  Physical Exam Updated Vital Signs BP (!) 189/98   Pulse (!) 56   Temp 97.7 F (36.5 C) (Oral)   Resp 19   Ht 4\' 10"  (1.473 m)   Wt 49 kg   SpO2 98%   BMI 22.58 kg/m  Physical Exam Vitals and nursing note reviewed.  Constitutional:      Appearance: Normal appearance.  HENT:     Head: Normocephalic and atraumatic.     Mouth/Throat:     Mouth: Mucous membranes are moist.  Eyes:     General: No scleral icterus. Cardiovascular:     Rate and Rhythm: Normal rate and regular rhythm.     Pulses: Normal pulses.     Heart sounds: Normal heart sounds.  Pulmonary:     Effort: Pulmonary effort is normal.     Breath sounds: Normal breath sounds.  Abdominal:     General: Abdomen is flat.      Palpations: Abdomen is soft.     Tenderness: There is no abdominal tenderness.  Musculoskeletal:        General: No deformity.  Skin:    General: Skin is warm.     Findings: No rash.  Neurological:     General: No focal deficit present.     Mental Status: She is alert.     Comments: Cranial nerves II through XII intact. Intact sensation to light touch in all 4 extremities. 5/5 strength in all 4 extremities. Intact finger-to-nose and heel-to-shin of all 4 extremities. No visual field cuts. No neglect noted. No aphasia noted.   Psychiatric:        Mood and Affect: Mood normal.     ED Results / Procedures / Treatments   Labs (all labs ordered are listed, but only abnormal results are displayed) Labs Reviewed  COMPREHENSIVE METABOLIC PANEL - Abnormal; Notable for the following components:      Result Value   Total Bilirubin 0.2 (*)    All other components within normal limits  LIPASE, BLOOD  CBC  HCG, SERUM, QUALITATIVE  URINALYSIS, ROUTINE W REFLEX MICROSCOPIC    EKG None  Radiology No results  found.  Procedures Procedures    Medications Ordered in ED Medications  ketorolac (TORADOL) 15 MG/ML injection 15 mg (15 mg Intravenous Given 09/04/22 2131)  metoCLOPramide (REGLAN) injection 10 mg (10 mg Intravenous Given 09/04/22 2132)  sodium chloride 0.9 % bolus 1,000 mL (1,000 mLs Intravenous New Bag/Given 09/04/22 2131)  diphenhydrAMINE (BENADRYL) injection 25 mg (25 mg Intravenous Given 09/04/22 2132)    ED Course/ Medical Decision Making/ A&P                             Medical Decision Making Amount and/or Complexity of Data Reviewed Labs: ordered. Radiology: ordered.  Risk Prescription drug management.   This patient presents to the ED for hadache, this involves an extensive number of treatment options, and is a complaint that carries with a high risk of complications and morbidity.  The differential diagnosis includes cluster headache, migraine, sinusitis,  tension headache, acute glaucoma, cervical artery dissection, CO poisoning, encephalitis, encephalopathy, meningitis, mass, pseudotumor, subarachnoid hemorrhage, temporal arteritis/giant cell arteritis, traumatic intracranial hemorrhage.  This is not an exhaustive list.  Lab tests: I ordered and personally interpreted labs.  The pertinent results include: WBC unremarkable. Hbg unremarkable. Platelets unremarkable. Electrolytes unremarkable. BUN, creatinine unremarkable. Lipase negative.  Pregnancy test negative.   Imaging studies: CT angio head neck ordered and pending.  Problem list/ ED course/ Critical interventions/ Medical management: HPI: See above Vital signs within normal range and stable throughout visit. Laboratory/imaging studies significant for: See above. On physical examination, patient is afebrile and appears in no acute distress.  Neuroexam without evidence of AMS, focal neurological findings so doubt stroke.  No history of trauma so doubt ICH.  Symptoms are most consistent with benign headache from either tension type headache versus migraine.  Patient did have similar pain in the past, about once a year, and she has never been diagnosed with migraine but states time the pain is more severe.  Patient does endorse neck pain with numbness going down to her right arm.  CT angio head and neck ordered to rule out any large vessel occlusion or other intracranial abnormalities.  Migraine cocktail ordered.  Patient is awake, alert, not in any acute distress at this point.  Will sign out to the attending at shift change for reevaluation after migraine cocktails and follow-up with CT angio head and neck. I have reviewed the patient home medicines and have made adjustments as needed.  Cardiac monitoring/EKG: The patient was maintained on a cardiac monitor.  I personally reviewed and interpreted the cardiac monitor which showed an underlying rhythm of: sinus rhythm.  Additional history  obtained: External records from outside source obtained and reviewed including: Chart review including previous notes, labs, imaging.  Consultations obtained:  Disposition Patient's care is signed out at shift change to the attending Dr. Renaye Rakers who will reevaluate patient after migraine cocktail and follow-up with the CT angio head and neck. This chart was dictated using voice recognition software.  Despite best efforts to proofread,  errors can occur which can change the documentation meaning.          Final Clinical Impression(s) / ED Diagnoses Final diagnoses:  Other migraine without status migrainosus, not intractable    Rx / DC Orders ED Discharge Orders     None         Jeanelle Malling, [place] 09/04/22 2157    Terald Sleeper, MD 09/05/22 [phone]

## 2022-09-04 NOTE — ED Triage Notes (Signed)
Pt complains of migraine with nausea x 1 week. Pt also states right arm goes to numb at night and has to be massaged to get feeling back.

## 2022-09-05 MED ORDER — IBUPROFEN 600 MG PO TABS: 600.0000 mg | ORAL_TABLET | Freq: Three times a day (TID) | ORAL | 0 refills | Status: DC | PRN

## 2022-09-05 MED ORDER — CYCLOBENZAPRINE HCL 10 MG PO TABS: 10.0000 mg | ORAL_TABLET | Freq: Two times a day (BID) | ORAL | 0 refills | Status: DC | PRN

## 2022-09-05 MED ORDER — DEXAMETHASONE SODIUM PHOSPHATE 10 MG/ML IJ SOLN
6.0000 mg | Freq: Once | INTRAMUSCULAR | Status: AC
  Administered 2022-09-05: 6 mg via INTRAVENOUS
  Filled 2022-09-05: qty 1

## 2022-09-05 NOTE — ED Notes (Signed)
Patient verbalizes understanding of discharge instructions. Opportunity for questioning and answers were provided. Armband removed by staff, pt discharged from ED. Pt ambulatory to ED waiting room with steady gait.  

## 2022-11-11 DIAGNOSIS — Z419 Encounter for procedure for purposes other than remedying health state, unspecified: Secondary | ICD-10-CM | POA: Diagnosis not present

## 2022-12-12 DIAGNOSIS — Z419 Encounter for procedure for purposes other than remedying health state, unspecified: Secondary | ICD-10-CM | POA: Diagnosis not present

## 2023-01-11 DIAGNOSIS — Z419 Encounter for procedure for purposes other than remedying health state, unspecified: Secondary | ICD-10-CM | POA: Diagnosis not present

## 2023-01-29 ENCOUNTER — Encounter (HOSPITAL_COMMUNITY): Payer: Self-pay

## 2023-01-29 ENCOUNTER — Ambulatory Visit (HOSPITAL_COMMUNITY)
Admission: EM | Admit: 2023-01-29 | Discharge: 2023-01-29 | Disposition: A | Discharge status: Home / Self Care | Payer: BC Managed Care – PPO | Attending: Emergency Medicine | Admitting: Emergency Medicine

## 2023-01-29 DIAGNOSIS — J069 Acute upper respiratory infection, unspecified: Secondary | ICD-10-CM | POA: Diagnosis not present

## 2023-01-29 LAB — POC COVID19/FLU A&B COMBO
Covid Antigen, POC: NEGATIVE
Influenza A Antigen, POC: NEGATIVE
Influenza B Antigen, POC: NEGATIVE

## 2023-01-29 NOTE — Discharge Instructions (Signed)
Your rapid influenza and COVID-19 antigen test today was negative.  No further virus testing is indicated.   Please read below to learn more about the medications, dosages and frequencies that I recommend to help alleviate your symptoms and to get you feeling better soon:   Advil, Motrin (ibuprofen): This is a good anti-inflammatory medication which addresses aches, pains and inflammation of the upper airways that causes sinus and nasal congestion as well as in the lower airways which makes your cough feel tight and sometimes burn.  I recommend that you take between 400 to 600 mg every 6-8 hours as needed.  Please do not take more than 2400 mg of ibuprofen in a 24-hour period and please do not take high doses of ibuprofen for more than 3 days in a row as this can lead to stomach ulcers.   Atrovent (ipratropium): This is an excellent nasal decongestant spray that will not cause rebound congestion.  Please instill 2 sprays into each nare after using the nasal steroid and repeat 2 more times throughout the day.  I have added this nasal spray to the nasal steroid because nasal steroids can take several days to reach full effect.  Once you find that you are forgetting to use the spray more often than you remember to use it, you will know that you no longer need it.  I have sent a prescription for this medication to your pharmacy because it cannot be purchased over-the-counter.   Promethazine DM: Promethazine is both a nasal decongestant that dries up mucous membranes and an antinausea medication.  Promethazine often makes most patients feel fairly sleepy.  "DM" is dextromethorphan, a single symptom reliever which is a cough suppressant found in many over-the-counter cough medications and combination cold preparations.  Please take 5 mL before bedtime to minimize your cough which will help you sleep better.  I have sent a prescription for this medication to your pharmacy because it cannot be purchased  over-the-counter.   If symptoms have not meaningfully improved in the next 5 to 7 days, please return for repeat evaluation or follow-up with your regular provider.  If symptoms have worsened in the next 3 to 5 days, please return for repeat evaluation or follow-up with your regular provider.    Thank you for visiting urgent care today.  We appreciate the opportunity to participate in your care.

## 2023-01-29 NOTE — ED Provider Notes (Signed)
[facility]    CSN: [id number] Arrival date & time: 01/29/23  1146    HISTORY   Chief Complaint  Patient presents with   Cough   HPI Gail Mcfarland is a pleasant, 47 y.o. female who presents to urgent care today. Patient c/o non-productive cough, nausea, vomiting, headache, and rhinorrhea X 3 days. States she has been taking Advil with little relief. Reports exposure to a sick coworker, does not know if coworker was tested for flu or COVID-19.  Patient has an elevated blood pressure on arrival with otherwise normal vital signs.  Denies sinus pain or pressure, loss of taste or smell, otalgia, fever, body aches, chills, diarrhea.  The history is provided by the patient. A language interpreter was used.   History reviewed. No pertinent past medical history. Patient Active Problem List   Diagnosis Date Noted   Irregular menses 11/25/2013   Routine adult health maintenance 11/25/2013   Past Surgical History:  Procedure Laterality Date   TUBAL LIGATION  2009/10   OB History   No obstetric history on file.    Home Medications    Prior to Admission medications   Not on File    Family History Family History  Problem Relation Age of Onset   Alcohol abuse Neg Hx    Cancer Neg Hx    Depression Neg Hx    Diabetes Neg Hx    Drug abuse Neg Hx    Early death Neg Hx    Heart disease Neg Hx    Hyperlipidemia Neg Hx    Hypertension Neg Hx    Kidney disease Neg Hx    Stroke Neg Hx    Social History Social History   Tobacco Use   Smoking status: Never   Smokeless tobacco: Never  Substance Use Topics   Alcohol use: No   Drug use: No   Allergies   Patient has no known allergies.  Review of Systems Review of Systems Pertinent findings revealed after performing a 14 point review of systems has been noted in the history of present illness.  Physical Exam Vital Signs BP (!) 173/83 (BP Location: Left Arm)   Pulse 68   Temp 97.7 F (36.5 C) (Oral)   Resp 16    Ht 4\' 8"  (1.422 m)   Wt 110 lb (49.9 kg)   LMP  (LMP Unknown)   SpO2 98%   BMI 24.66 kg/m   No data found.  Physical Exam Vitals and nursing note reviewed.  Constitutional:      General: She is not in acute distress.    Appearance: Normal appearance. She is not ill-appearing.  HENT:     Head: Normocephalic and atraumatic.     Salivary Glands: Right salivary gland is not diffusely enlarged or tender. Left salivary gland is not diffusely enlarged or tender.     Right Ear: Tympanic membrane, ear canal and external ear normal. No drainage. No middle ear effusion. There is no impacted cerumen. Tympanic membrane is not erythematous or bulging.     Left Ear: Tympanic membrane, ear canal and external ear normal. No drainage.  No middle ear effusion. There is no impacted cerumen. Tympanic membrane is not erythematous or bulging.     Nose: Nose normal. No nasal deformity, septal deviation, mucosal edema, congestion or rhinorrhea.     Right Turbinates: Not enlarged, swollen or pale.     Left Turbinates: Not enlarged, swollen or pale.     Right Sinus: No maxillary sinus  tenderness or frontal sinus tenderness.     Left Sinus: No maxillary sinus tenderness or frontal sinus tenderness.     Mouth/Throat:     Lips: Pink. No lesions.     Mouth: Mucous membranes are moist. No oral lesions.     Pharynx: Oropharynx is clear. Uvula midline. No posterior oropharyngeal erythema or uvula swelling.     Tonsils: No tonsillar exudate. 0 on the right. 0 on the left.  Eyes:     General: Lids are normal.        Right eye: No discharge.        Left eye: No discharge.     Extraocular Movements: Extraocular movements intact.     Conjunctiva/sclera: Conjunctivae normal.     Right eye: Right conjunctiva is not injected.     Left eye: Left conjunctiva is not injected.  Neck:     Trachea: Trachea and phonation normal.  Cardiovascular:     Rate and Rhythm: Normal rate and regular rhythm.     Pulses: Normal  pulses.     Heart sounds: Normal heart sounds. No murmur heard.    No friction rub. No gallop.  Pulmonary:     Effort: Pulmonary effort is normal. No accessory muscle usage, prolonged expiration or respiratory distress.     Breath sounds: Normal breath sounds. No stridor, decreased air movement or transmitted upper airway sounds. No decreased breath sounds, wheezing, rhonchi or rales.  Chest:     Chest wall: No tenderness.  Musculoskeletal:        General: Normal range of motion.     Cervical back: Normal range of motion and neck supple. Normal range of motion.  Lymphadenopathy:     Cervical: No cervical adenopathy.  Skin:    General: Skin is warm and dry.     Findings: No erythema or rash.  Neurological:     General: No focal deficit present.     Mental Status: She is alert and oriented to person, place, and time.  Psychiatric:        Mood and Affect: Mood normal.        Behavior: Behavior normal.     Visual Acuity Right Eye Distance:   Left Eye Distance:   Bilateral Distance:    Right Eye Near:   Left Eye Near:    Bilateral Near:     UC Couse / Diagnostics / Procedures:     Radiology No results found.  Procedures Procedures (including critical care time) EKG  Pending results:  Labs Reviewed  POC COVID19/FLU A&B COMBO    Medications Ordered in UC: Medications - No data to display  UC Diagnoses / Final Clinical Impressions(s)   I have reviewed the triage vital signs and the nursing notes.  Pertinent labs & imaging results that were available during my care of the patient were reviewed by me and considered in my medical decision making (see chart for details).    Final diagnoses:  Viral URI with cough   Patient advised of negative COVID-19 and influenza test.  Physical exam today was unremarkable.  Patient advised she is likely suffering from one of the many less serious illnesses circulating in our community right now.  Supportive medications discussed and  prescribed.  Conservative care recommended.  Return precautions advised.  Please see discharge instructions below for details of plan of care as provided to patient. ED Prescriptions   None    PDMP not reviewed this encounter.  Pending results:  Labs Reviewed  POC COVID19/FLU A&B COMBO      Discharge Instructions      Your rapid influenza and COVID-19 antigen test today was negative.  No further virus testing is indicated.   Please read below to learn more about the medications, dosages and frequencies that I recommend to help alleviate your symptoms and to get you feeling better soon:   Advil, Motrin (ibuprofen): This is a good anti-inflammatory medication which addresses aches, pains and inflammation of the upper airways that causes sinus and nasal congestion as well as in the lower airways which makes your cough feel tight and sometimes burn.  I recommend that you take between 400 to 600 mg every 6-8 hours as needed.  Please do not take more than 2400 mg of ibuprofen in a 24-hour period and please do not take high doses of ibuprofen for more than 3 days in a row as this can lead to stomach ulcers.   Atrovent (ipratropium): This is an excellent nasal decongestant spray that will not cause rebound congestion.  Please instill 2 sprays into each nare after using the nasal steroid and repeat 2 more times throughout the day.  I have added this nasal spray to the nasal steroid because nasal steroids can take several days to reach full effect.  Once you find that you are forgetting to use the spray more often than you remember to use it, you will know that you no longer need it.  I have sent a prescription for this medication to your pharmacy because it cannot be purchased over-the-counter.   Promethazine DM: Promethazine is both a nasal decongestant that dries up mucous membranes and an antinausea medication.  Promethazine often makes most patients feel fairly sleepy.  "DM" is  dextromethorphan, a single symptom reliever which is a cough suppressant found in many over-the-counter cough medications and combination cold preparations.  Please take 5 mL before bedtime to minimize your cough which will help you sleep better.  I have sent a prescription for this medication to your pharmacy because it cannot be purchased over-the-counter.   If symptoms have not meaningfully improved in the next 5 to 7 days, please return for repeat evaluation or follow-up with your regular provider.  If symptoms have worsened in the next 3 to 5 days, please return for repeat evaluation or follow-up with your regular provider.    Thank you for visiting urgent care today.  We appreciate the opportunity to participate in your care.       Disposition Upon Discharge:  Condition: stable for discharge home  Patient presented with an acute illness with associated systemic symptoms and significant discomfort requiring urgent management. In my opinion, this is a condition that a prudent lay person (someone who possesses an average knowledge of health and medicine) may potentially expect to result in complications if not addressed urgently such as respiratory distress, impairment of bodily function or dysfunction of bodily organs.   Routine symptom specific, illness specific and/or disease specific instructions were discussed with the patient and/or caregiver at length.   As such, the patient has been evaluated and assessed, work-up was performed and treatment was provided in alignment with urgent care protocols and evidence based medicine.  Patient/parent/caregiver has been advised that the patient may require follow up for further testing and treatment if the symptoms continue in spite of treatment, as clinically indicated and appropriate.  Patient/parent/caregiver has been advised to return to the [facility] or PCP if no better; to PCP or the Emergency Department  if new signs and symptoms develop, or if the  current signs or symptoms continue to change or worsen for further workup, evaluation and treatment as clinically indicated and appropriate  The patient will follow up with their current PCP if and as advised. If the patient does not currently have a PCP we will assist them in obtaining one.   The patient may need specialty follow up if the symptoms continue, in spite of conservative treatment and management, for further workup, evaluation, consultation and treatment as clinically indicated and appropriate.  Patient/parent/caregiver verbalized understanding and agreement of plan as discussed.  All questions were addressed during visit.  Please see discharge instructions below for further details of plan.  This office note has been dictated using Teaching laboratory technician.  Unfortunately, this method of dictation can sometimes lead to typographical or grammatical errors.  I apologize for your inconvenience in advance if this occurs.  Please do not hesitate to reach out to me if clarification is needed.      Theadora Rama Scales, PA-C 01/29/23 [phone]

## 2023-01-29 NOTE — ED Triage Notes (Signed)
Patient here today with c/o cough, nausea, vomiting, headache, and runny nose X 2-3 days. She has been taking Advil with little relief. A coworker was also sick.

## 2023-02-11 DIAGNOSIS — Z419 Encounter for procedure for purposes other than remedying health state, unspecified: Secondary | ICD-10-CM | POA: Diagnosis not present

## 2023-03-14 DIAGNOSIS — Z419 Encounter for procedure for purposes other than remedying health state, unspecified: Secondary | ICD-10-CM | POA: Diagnosis not present

## 2023-04-11 DIAGNOSIS — Z419 Encounter for procedure for purposes other than remedying health state, unspecified: Secondary | ICD-10-CM | POA: Diagnosis not present

## 2023-05-23 DIAGNOSIS — Z419 Encounter for procedure for purposes other than remedying health state, unspecified: Secondary | ICD-10-CM | POA: Diagnosis not present

## 2023-06-22 DIAGNOSIS — Z419 Encounter for procedure for purposes other than remedying health state, unspecified: Secondary | ICD-10-CM | POA: Diagnosis not present

## 2023-07-23 DIAGNOSIS — Z419 Encounter for procedure for purposes other than remedying health state, unspecified: Secondary | ICD-10-CM | POA: Diagnosis not present

## 2023-08-22 DIAGNOSIS — Z419 Encounter for procedure for purposes other than remedying health state, unspecified: Secondary | ICD-10-CM | POA: Diagnosis not present

## 2023-09-22 DIAGNOSIS — Z419 Encounter for procedure for purposes other than remedying health state, unspecified: Secondary | ICD-10-CM | POA: Diagnosis not present

## 2023-10-23 DIAGNOSIS — Z419 Encounter for procedure for purposes other than remedying health state, unspecified: Secondary | ICD-10-CM | POA: Diagnosis not present

## 2023-11-22 DIAGNOSIS — Z419 Encounter for procedure for purposes other than remedying health state, unspecified: Secondary | ICD-10-CM | POA: Diagnosis not present

## 2023-12-28 ENCOUNTER — Emergency Department (HOSPITAL_COMMUNITY)

## 2023-12-28 ENCOUNTER — Emergency Department (HOSPITAL_COMMUNITY)
Admission: EM | Admit: 2023-12-28 | Discharge: 2023-12-28 | Disposition: A | Discharge status: Home / Self Care | Attending: Emergency Medicine | Admitting: Emergency Medicine

## 2023-12-28 ENCOUNTER — Other Ambulatory Visit: Payer: Self-pay

## 2023-12-28 DIAGNOSIS — X501XXA Overexertion from prolonged static or awkward postures, initial encounter: Secondary | ICD-10-CM | POA: Insufficient documentation

## 2023-12-28 DIAGNOSIS — S82831A Other fracture of upper and lower end of right fibula, initial encounter for closed fracture: Secondary | ICD-10-CM | POA: Insufficient documentation

## 2023-12-28 DIAGNOSIS — M7989 Other specified soft tissue disorders: Secondary | ICD-10-CM | POA: Diagnosis not present

## 2023-12-28 DIAGNOSIS — S99911A Unspecified injury of right ankle, initial encounter: Secondary | ICD-10-CM | POA: Diagnosis present

## 2023-12-28 LAB — ETHANOL: Alcohol, Ethyl (B): <15 mg/dL (ref <15)

## 2023-12-28 MED ORDER — ACETAMINOPHEN 500 MG PO TABS
1000.0000 mg | ORAL_TABLET | Freq: Once | ORAL | Status: AC
  Administered 2023-12-28: 1000 mg via ORAL
  Filled 2023-12-28: qty 2

## 2023-12-28 MED ORDER — HYDROCODONE-ACETAMINOPHEN 5-325 MG PO TABS
1.0000 | ORAL_TABLET | Freq: Once | ORAL | Status: AC
  Administered 2023-12-28: 1 via ORAL
  Filled 2023-12-28: qty 1

## 2023-12-28 NOTE — Discharge Instructions (Addendum)
 You were diagnosed today with a right sided ankle fracture.  The fibula is one of the bones in the lower leg is fractured.  Use the provided boot and crutches.  You may bear weight as tolerated.  Please follow-up with orthopedic surgery in 1 week.  Please take Tylenol and ibuprofen  for pain control at home.  Return to the emergency department if you develop any life-threatening symptoms.

## 2023-12-28 NOTE — ED Triage Notes (Signed)
 Pt reports injury to right leg.

## 2023-12-28 NOTE — ED Provider Notes (Signed)
 [facility] AT [facility] Provider Note   CSN: [id number] Arrival date & time: 12/28/23  1243     Patient presents with: Ankle Pain   Gail Mcfarland is a 48 y.o. female. Patient with no relevant past medical history presents to the emergency department complaining of right sided ankle pain. She reports slipping and and rolling her right ankle. She denies other injuries. She is unable to bear weight.     Ankle Pain      Prior to Admission medications   Not on File    Allergies: Patient has no known allergies.    Review of Systems  Updated Vital Signs BP (!) 170/79 (BP Location: Left Arm)   Pulse 65   Temp 98.3 F (36.8 C)   Resp 16   Ht 4' 8 (1.422 m)   Wt 54 kg   SpO2 98%   BMI 26.68 kg/m   Physical Exam Vitals and nursing note reviewed.  HENT:     Head: Normocephalic and atraumatic.  Eyes:     Pupils: Pupils are equal, round, and reactive to light.  Pulmonary:     Effort: Pulmonary effort is normal. No respiratory distress.  Musculoskeletal:        General: Swelling, tenderness and signs of injury present. No deformity.     Cervical back: Normal range of motion.     Comments: Lateral right sided ankle pain. Patient is able to move her right ankle and toes but reports pain with ankle movements. Sensation intact. Palpable pedal pulses.   Skin:    General: Skin is dry.  Neurological:     Mental Status: She is alert.  Psychiatric:        Speech: Speech normal.        Behavior: Behavior normal.     (all labs ordered are listed, but only abnormal results are displayed) Labs Reviewed  ETHANOL  RAPID URINE DRUG SCREEN, HOSP PERFORMED    EKG: None  Radiology: DG Foot Complete Right Result Date: 12/28/2023 EXAM: 3 OR MORE VIEW(S) XRAY OF THE RIGHT FOOT 12/28/2023 02:48:00 PM COMPARISON: Same day ankle radiograph. CLINICAL HISTORY: lateral pain/swelling FINDINGS: BONES AND JOINTS: Acute distal right fibular fracture with  posterior dislocation. Small posterior calcaneal osteophytes. Chronic deformity at the distal lateral aspect of the 5th toe proximal phalanx. Small right ankle effusion. SOFT TISSUES: Soft tissue swelling about the right ankle more pronounced over the lateral aspect. IMPRESSION: 1. Distal right fibular fracture with posterior dislocation. 2. Small right ankle effusion. 3. Soft tissue swelling about the right ankle, more pronounced over the lateral aspect. Electronically signed by: Donnice Mania MD 12/28/2023 03:41 PM EST RP Workstation: [id number]   DG Ankle Complete Right Result Date: 12/28/2023 EXAM: 3 OR MORE VIEW(S) XRAY OF THE RIGHT ANKLE 12/28/2023 02:48:00 PM CLINICAL HISTORY: lateral pain/swelling lateral pain/swelling COMPARISON: None available. FINDINGS: BONES AND JOINTS: Comminuted fracture of the distal right fibula with fracture extending above the level of the syndesmosis. Question mild widening of the ankle mortise. Small ankle effusion. Posterior calcaneal enthesophytes. No focal osseous lesion. No joint dislocation. SOFT TISSUES: There is soft tissue swelling about the right ankle more pronounced over the lateral aspect. IMPRESSION: 1. Comminuted distal right fibular fracture extending above the level of the syndesmosis. 2. Possible mild widening of the ankle mortise. 3. Small ankle effusion. 4. Soft tissue swelling about the right ankle, greater laterally. Electronically signed by: Donnice Mania MD 12/28/2023 03:39 PM EST RP Workstation: [id number]     .  Ortho Injury Treatment  Date/Time: 12/28/2023 11:02 PM  Performed by: Logan Ubaldo NOVAK, PA-C Authorized by: Logan Ubaldo NOVAK, PA-C   Consent:    Consent obtained:  Verbal   Consent given by:  PatientInjury location: ankle Location details: right ankle Injury type: fracture-dislocation Fracture type: lateral malleolus Pre-procedure neurovascular assessment: neurovascularly intact Manipulation performed: no Immobilization:  crutches and brace Splint Applied by: Kemp Balm Post-procedure neurovascular assessment: post-procedure neurovascularly intact Comments: Cam boot and crutches      Medications Ordered in the ED  acetaminophen  (TYLENOL ) tablet 1,000 mg (1,000 mg Oral Given 12/28/23 1406)  HYDROcodone -acetaminophen  (NORCO/VICODIN) 5-325 MG per tablet 1 tablet (1 tablet Oral Given 12/28/23 2302)                                    Medical Decision Making  This patient presents to the ED for concern of ankle pain, this involves an extensive number of treatment options, and is a complaint that carries with it a high risk of complications and morbidity.  The differential diagnosis includes fracture, dislocation, soft tissue injury, others   Co morbidities / Chronic conditions that complicate the patient evaluation  None   Lab Tests:  I personally interpreted labs. Ethanol ordered from triage, apparently at request of workers comp, was negative   Imaging Studies ordered:  I ordered imaging studies including plain films of the right foot and ankle  I independently visualized and interpreted imaging which showed  1. Comminuted distal right fibular fracture extending above the level of the  syndesmosis.  2. Possible mild widening of the ankle mortise.  3. Small ankle effusion.  4. Soft tissue swelling about the right ankle, greater laterally.   I agree with the radiologist interpretation    Problem List / ED Course / Critical interventions / Medication management   I ordered medication including norco   Reevaluation of the patient after these medicines showed that the patient improved I have reviewed the patients home medicines and have made adjustments as needed   Consultations Obtained:  I requested consultation with the orthopedic surgeon, Dr.Bokshan,  and discussed lab and imaging findings as well as pertinent plan - they recommend: CAM boot, weight bear as tolerated, follow up in 1  week   Test / Admission - Considered:  Patient with comminuted right distal fibular fracture.  Discussed with orthopedic surgery with concerns about stability.  He reviewed the images personally and recommended a cam boot with crutches.  Patient provided with cam boot and crutches.  Instructions for follow-up provided.  At this time no indication for further emergent workup or admission.      Final diagnoses:  Closed fracture of distal end of right fibula, unspecified fracture morphology, initial encounter    ED Discharge Orders     None          Logan Ubaldo NOVAK DEVONNA 12/28/23 2339    Pamella Ozell LABOR, DO 01/01/24 0015

## 2023-12-28 NOTE — Progress Notes (Signed)
 Orthopedic Tech Progress Note Patient Details:  Gail Mcfarland 07-22-1975 [id number]  Ortho Devices Type of Ortho Device: CAM walker, Crutches Ortho Device/Splint Location: rle Ortho Device/Splint Interventions: Ordered, Application, Adjustment  I applied the boot and taught them how to apply it. Then I taught them how to use the crutches and got up and walked  with them well. Post Interventions Patient Tolerated: Well Instructions Provided: Care of device, Adjustment of device  Chandra Dorn PARAS 12/28/2023, 11:35 PM

## 2023-12-28 NOTE — ED Provider Triage Note (Signed)
 Emergency Medicine Provider Triage Evaluation Note  Gail Mcfarland , a 48 y.o. female  was evaluated in triage.  Pt complains of right lateral ankle pain and swelling after having tripped and fallen while she was at work, rolling on the right ankle, likely inverting the ankle.  She now has pain with ambulation, and is tender to the ankle and foot..  Review of Systems  Positive: As above Negative:   Physical Exam  BP (!) 161/88 (BP Location: Left Arm)   Pulse 76   Temp 97.8 F (36.6 C)   Resp 17   Ht 4' 8 (1.422 m)   Wt 54 kg   SpO2 99%   BMI 26.68 kg/m  Gen:   Awake, no distress   Resp:  Normal effort  MSK:   Moves extremities without difficulty  Other:  Good distal pulses bilateral lower extremities, notable swelling to the lateral malleolus with tenderness to the ATFL, lateral malleolus, tenderness at the forefoot  Medical Decision Making  Medically screening exam initiated at 2:02 PM.  Appropriate orders placed.  Athina H Burkhalter was informed that the remainder of the evaluation will be completed by another provider, this initial triage assessment does not replace that evaluation, and the importance of remaining in the ED until their evaluation is complete.  Initial imaging orders placed, UDS and ethanol placed Workmen's Comp. request.   Myriam Dorn BROCKS, PA 12/28/23 [phone]

## 2023-12-28 NOTE — ED Notes (Signed)
 Called ortho tech to assit with CAM boot and crutches.

## 2023-12-28 NOTE — ED Triage Notes (Signed)
 Pt came in from work d/t her Rt foot slipping from underneath her & fell. She was immediately unable to bear any wt on it & it was wrapped & noted to have immediate swelling after it happened.

## 2023-12-29 ENCOUNTER — Telehealth (HOSPITAL_BASED_OUTPATIENT_CLINIC_OR_DEPARTMENT_OTHER): Payer: Self-pay | Admitting: Orthopaedic Surgery

## 2023-12-29 NOTE — Telephone Encounter (Signed)
 Sch this patient on 12/3 let me know if this patient needs to come in earlier that the sch date. Best contact [phone]

## 2024-01-13 ENCOUNTER — Ambulatory Visit (INDEPENDENT_AMBULATORY_CARE_PROVIDER_SITE_OTHER)

## 2024-01-13 ENCOUNTER — Ambulatory Visit (INDEPENDENT_AMBULATORY_CARE_PROVIDER_SITE_OTHER): Admitting: Orthopaedic Surgery

## 2024-01-13 DIAGNOSIS — M25571 Pain in right ankle and joints of right foot: Secondary | ICD-10-CM

## 2024-01-13 NOTE — Progress Notes (Signed)
    Chief Complaint: Right ankle follow-up     History of Present Illness:    Gail Mcfarland is a 48 y.o. female presents today for 2-week follow-up after right ankle fracture.  He has been weightbearing as tolerated.  She has been compliant with cam boot walker.  She is here today for further assessment    PMH/PSH/Family History/Social History/Meds/Allergies:   No past medical history on file. Past Surgical History:  Procedure Laterality Date   TUBAL LIGATION  2009/10   Social History   Socioeconomic History   Marital status: Married    Spouse name: Not on file   Number of children: Not on file   Years of education: Not on file   Highest education level: Not on file  Occupational History   Not on file  Tobacco Use   Smoking status: Never   Smokeless tobacco: Never  Substance and Sexual Activity   Alcohol use: No   Drug use: No   Sexual activity: Yes    Birth control/protection: Surgical  Other Topics Concern   Not on file  Social History Narrative   Not on file   Social Drivers of Health   Financial Resource Strain: Not on file  Food Insecurity: Not on file  Transportation Needs: Not on file  Physical Activity: Not on file  Stress: Not on file  Social Connections: Not on file   Family History  Problem Relation Age of Onset   Alcohol abuse Neg Hx    Cancer Neg Hx    Depression Neg Hx    Diabetes Neg Hx    Drug abuse Neg Hx    Early death Neg Hx    Heart disease Neg Hx    Hyperlipidemia Neg Hx    Hypertension Neg Hx    Kidney disease Neg Hx    Stroke Neg Hx    No Known Allergies No current outpatient medications on file.   No current facility-administered medications for this visit.   No results found.  Review of Systems:   A ROS was performed including pertinent positives and negatives as documented in the HPI.  Physical Exam :   Constitutional: NAD and appears stated age Neurological: Alert and oriented Psych: Appropriate affect and  cooperative There were no vitals taken for this visit.   Comprehensive Musculoskeletal Exam:    Right ankle with tenderness about the lateral fibula.  Mild swelling.  She is able to walk with an antalgic gait   Imaging:   Xray (3 views right ankle): Weber B fibula fracture without evidence of medial clear space widening or significant displacement     I personally reviewed and interpreted the radiographs.   Assessment and Plan:   48 y.o. female with Weber B ankle fibular fracture which has been stable in nature.  At this time I have advised that I do believe that she will continue to heal without surgical treatment.  I will plan to see her back in 8 weeks for reassessment.  She may continue to weight-bear as tolerated and wean her boot as tolerated   I personally saw and evaluated the patient, and participated in the management and treatment plan.  Elspeth Parker, MD Attending Physician, Orthopedic Surgery  This document was dictated using Dragon voice recognition software. A reasonable attempt at proof reading has been made to minimize errors.

## 2024-01-22 DIAGNOSIS — Z419 Encounter for procedure for purposes other than remedying health state, unspecified: Secondary | ICD-10-CM | POA: Diagnosis not present

## 2024-03-23 ENCOUNTER — Ambulatory Visit (HOSPITAL_BASED_OUTPATIENT_CLINIC_OR_DEPARTMENT_OTHER): Admitting: Orthopaedic Surgery
